# Patient Record
Sex: Female | Born: 1960 | Race: Black or African American | Hispanic: No | Smoking: Never smoker
Health system: Southern US, Community
[De-identification: ages and names within clinical notes are randomized; demographics above are authoritative.]

## PROBLEM LIST (undated history)

## (undated) DIAGNOSIS — D709 Neutropenia, unspecified: Secondary | ICD-10-CM

## (undated) DIAGNOSIS — M79672 Pain in left foot: Secondary | ICD-10-CM

## (undated) DIAGNOSIS — H6993 Unspecified Eustachian tube disorder, bilateral: Secondary | ICD-10-CM

## (undated) DIAGNOSIS — C50919 Malignant neoplasm of unspecified site of unspecified female breast: Secondary | ICD-10-CM

## (undated) DIAGNOSIS — R000 Tachycardia, unspecified: Secondary | ICD-10-CM

## (undated) DIAGNOSIS — H6983 Other specified disorders of Eustachian tube, bilateral: Secondary | ICD-10-CM

## (undated) DIAGNOSIS — M79671 Pain in right foot: Secondary | ICD-10-CM

## (undated) HISTORY — DX: Tachycardia, unspecified: R00.0

## (undated) HISTORY — DX: Unspecified eustachian tube disorder, bilateral: H69.93

## (undated) HISTORY — DX: Pain in right foot: M79.671

## (undated) HISTORY — DX: Pain in right foot: M79.672

## (undated) HISTORY — PX: CARDIAC CATHETERIZATION: SHX172

## (undated) HISTORY — DX: Neutropenia, unspecified: D70.9

## (undated) HISTORY — DX: Other specified disorders of eustachian tube, bilateral: H69.83

---

## 1997-12-13 ENCOUNTER — Ambulatory Visit (HOSPITAL_COMMUNITY): Admission: RE | Admit: 1997-12-13 | Discharge: 1997-12-13 | Payer: Self-pay | Admitting: *Deleted

## 1997-12-19 ENCOUNTER — Encounter: Admission: RE | Admit: 1997-12-19 | Discharge: 1998-03-19 | Payer: Self-pay | Admitting: *Deleted

## 1998-03-23 ENCOUNTER — Encounter: Admission: RE | Admit: 1998-03-23 | Discharge: 1998-05-11 | Payer: Self-pay

## 1998-09-05 ENCOUNTER — Encounter: Payer: Self-pay | Admitting: Gastroenterology

## 1998-09-05 ENCOUNTER — Ambulatory Visit (HOSPITAL_COMMUNITY): Admission: RE | Admit: 1998-09-05 | Discharge: 1998-09-05 | Payer: Self-pay | Admitting: Gastroenterology

## 1999-05-09 ENCOUNTER — Emergency Department (HOSPITAL_COMMUNITY): Admission: EM | Admit: 1999-05-09 | Discharge: 1999-05-09 | Payer: Self-pay | Admitting: Emergency Medicine

## 1999-06-24 ENCOUNTER — Ambulatory Visit (HOSPITAL_COMMUNITY): Admission: RE | Admit: 1999-06-24 | Discharge: 1999-06-24 | Payer: Self-pay | Admitting: Hematology & Oncology

## 1999-06-24 ENCOUNTER — Encounter: Payer: Self-pay | Admitting: Hematology & Oncology

## 1999-06-25 ENCOUNTER — Ambulatory Visit (HOSPITAL_COMMUNITY): Admission: RE | Admit: 1999-06-25 | Discharge: 1999-06-25 | Payer: Self-pay | Admitting: Hematology & Oncology

## 1999-06-25 ENCOUNTER — Encounter: Admission: RE | Admit: 1999-06-25 | Discharge: 1999-06-25 | Payer: Self-pay | Admitting: Hematology & Oncology

## 1999-06-25 ENCOUNTER — Encounter: Payer: Self-pay | Admitting: Hematology & Oncology

## 1999-08-15 ENCOUNTER — Other Ambulatory Visit: Admission: RE | Admit: 1999-08-15 | Discharge: 1999-08-15 | Payer: Self-pay | Admitting: Obstetrics and Gynecology

## 1999-08-15 ENCOUNTER — Ambulatory Visit (HOSPITAL_COMMUNITY): Admission: RE | Admit: 1999-08-15 | Discharge: 1999-08-15 | Payer: Self-pay | Admitting: Obstetrics and Gynecology

## 1999-11-28 ENCOUNTER — Encounter: Admission: RE | Admit: 1999-11-28 | Discharge: 1999-11-28 | Payer: Self-pay | Admitting: Hematology & Oncology

## 1999-11-28 ENCOUNTER — Encounter: Payer: Self-pay | Admitting: Hematology & Oncology

## 1999-12-31 ENCOUNTER — Ambulatory Visit (HOSPITAL_COMMUNITY): Admission: RE | Admit: 1999-12-31 | Discharge: 1999-12-31 | Payer: Self-pay | Admitting: Neurology

## 2000-03-12 ENCOUNTER — Encounter: Admission: RE | Admit: 2000-03-12 | Discharge: 2000-03-12 | Payer: Self-pay | Admitting: Hematology & Oncology

## 2000-03-12 ENCOUNTER — Encounter: Payer: Self-pay | Admitting: Hematology & Oncology

## 2001-05-28 ENCOUNTER — Encounter: Payer: Self-pay | Admitting: Internal Medicine

## 2001-05-28 ENCOUNTER — Ambulatory Visit (HOSPITAL_COMMUNITY): Admission: RE | Admit: 2001-05-28 | Discharge: 2001-05-28 | Payer: Self-pay | Admitting: Internal Medicine

## 2001-06-30 ENCOUNTER — Encounter: Admission: RE | Admit: 2001-06-30 | Discharge: 2001-06-30 | Payer: Self-pay | Admitting: Infectious Diseases

## 2001-07-14 ENCOUNTER — Encounter: Admission: RE | Admit: 2001-07-14 | Discharge: 2001-07-14 | Payer: Self-pay | Admitting: Infectious Diseases

## 2001-09-01 ENCOUNTER — Ambulatory Visit (HOSPITAL_COMMUNITY): Admission: RE | Admit: 2001-09-01 | Discharge: 2001-09-01 | Payer: Self-pay | Admitting: Cardiology

## 2002-02-23 ENCOUNTER — Other Ambulatory Visit: Admission: RE | Admit: 2002-02-23 | Discharge: 2002-02-23 | Payer: Self-pay | Admitting: Internal Medicine

## 2004-05-31 ENCOUNTER — Ambulatory Visit: Payer: Self-pay | Admitting: Internal Medicine

## 2004-07-26 ENCOUNTER — Ambulatory Visit: Payer: Self-pay | Admitting: Internal Medicine

## 2005-03-05 ENCOUNTER — Emergency Department (HOSPITAL_COMMUNITY): Admission: EM | Admit: 2005-03-05 | Discharge: 2005-03-06 | Payer: Self-pay | Admitting: Emergency Medicine

## 2005-07-16 ENCOUNTER — Encounter: Payer: Self-pay | Admitting: *Deleted

## 2005-09-23 ENCOUNTER — Emergency Department (HOSPITAL_COMMUNITY): Admission: EM | Admit: 2005-09-23 | Discharge: 2005-09-23 | Payer: Self-pay | Admitting: Emergency Medicine

## 2005-09-26 ENCOUNTER — Encounter (HOSPITAL_COMMUNITY): Admission: RE | Admit: 2005-09-26 | Discharge: 2005-11-27 | Payer: Self-pay | Admitting: Emergency Medicine

## 2005-12-02 ENCOUNTER — Ambulatory Visit: Payer: Self-pay | Admitting: Internal Medicine

## 2005-12-16 ENCOUNTER — Ambulatory Visit (HOSPITAL_COMMUNITY): Admission: RE | Admit: 2005-12-16 | Discharge: 2005-12-16 | Payer: Self-pay | Admitting: Internal Medicine

## 2005-12-17 ENCOUNTER — Ambulatory Visit: Payer: Self-pay | Admitting: Internal Medicine

## 2006-02-20 ENCOUNTER — Encounter: Admission: RE | Admit: 2006-02-20 | Discharge: 2006-02-20 | Payer: Self-pay | Admitting: Family Medicine

## 2006-08-31 ENCOUNTER — Other Ambulatory Visit: Admission: RE | Admit: 2006-08-31 | Discharge: 2006-08-31 | Payer: Self-pay | Admitting: Family Medicine

## 2007-09-15 ENCOUNTER — Emergency Department (HOSPITAL_COMMUNITY): Admission: EM | Admit: 2007-09-15 | Discharge: 2007-09-15 | Payer: Self-pay | Admitting: Emergency Medicine

## 2007-09-16 ENCOUNTER — Other Ambulatory Visit: Admission: RE | Admit: 2007-09-16 | Discharge: 2007-09-16 | Payer: Self-pay | Admitting: Family Medicine

## 2008-12-01 ENCOUNTER — Emergency Department (HOSPITAL_COMMUNITY): Admission: EM | Admit: 2008-12-01 | Discharge: 2008-12-01 | Payer: Self-pay | Admitting: Emergency Medicine

## 2010-06-21 LAB — CBC
Platelets: 196 10*3/uL (ref 150–400)
WBC: 4.1 10*3/uL (ref 4.0–10.5)

## 2010-06-21 LAB — POCT I-STAT, CHEM 8
BUN: 14 mg/dL (ref 6–23)
Creatinine, Ser: 0.5 mg/dL (ref 0.4–1.2)
Hemoglobin: 14.6 g/dL (ref 12.0–15.0)
Potassium: 3.8 mEq/L (ref 3.5–5.1)
Sodium: 138 mEq/L (ref 135–145)

## 2010-06-21 LAB — DIFFERENTIAL
Basophils Absolute: 0 10*3/uL (ref 0.0–0.1)
Eosinophils Absolute: 0 10*3/uL (ref 0.0–0.7)
Lymphocytes Relative: 46 % (ref 12–46)
Lymphs Abs: 1.9 10*3/uL (ref 0.7–4.0)
Neutrophils Relative %: 33 % — ABNORMAL LOW (ref 43–77)

## 2010-08-02 NOTE — Assessment & Plan Note (Signed)
[facility]                               PULMONARY OFFICE NOTE   NAME:[name], [name]                     MRN:          [id number]  DATE:12/02/2005                            DOB:          04/11/1960    CHIEF COMPLAINT:  Dyspnea.   HISTORY OF PRESENT ILLNESS:  This is a 50 year old black female with  paroxysmal dyspnea that she first noticed in her 46s and varied between  having several attacks a week to having no attacks per month.  Almost all  of her attacks occur while sleeping.  She wakes up from a sound sleep with  severe sense of dyspnea, almost to the point of choking but denies any  loss of voice.  These episodes last from five minutes to less than an hour  and she apparently has called the paramedics twice.  She denies any  associated pleuritic or exertional chest pain, classic orthopnea, PND, or  associated leg swelling.   PAST MEDICAL HISTORY:  Significant for GERD, for which she was apparently  seen by Dr. Eloise Harman, but records are not available at the time of  dictation.  She is not on any chronic medication for GERD.   ALLERGIES:  None known.   MEDICATIONS:  Toprol 25 mg one daily.   SOCIAL HISTORY:  She quit smoking 30 years ago.  She denied any significant  respiratory complaints then.  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   FAMILY HISTORY:  Significant for asthma in brother, otherwise negative for  respiratory disease.  Negative for heart disease as well.   REVIEW OF SYSTEMS:  Taken in detail on the worksheet and significant for the  problems as outlined above.   PHYSICAL EXAMINATION:  GENERAL:  This is a slightly anxious black female in  acute distress.  VITAL SIGNS:  The patient had normal vital signs.  HEENT:  Normal except for hoarse phonation.  Oropharynx is clear.  NECK:  Supple without cervical adenopathy or tenderness.  Trachea is  midline.  LUNGS:  Lung fields perfectly clear bilaterally to auscultation and  percussion.  HEART:  Regular rhythm.  No murmur, rub, or gallop.  ABDOMEN:  Soft.  Benign.  EXTREMITIES:  Warm without calf tenderness, cyanosis, or clubbing.   IMPRESSION:  Probable nocturnal gastroesophageal reflux disease dating back  to her 54s, in a patient who previously was diagnosed with GERD by Dr.  Eloise Harman, but does not, I believe, perceive that these spells of choking  are actually related directly to acid reflux.  I explained that nocturnal  episodes of asthma can actually be caused by GERD, but the fact is that  these resolve with or without treatment, would indicate  asthma being less  likely.   I did recommend the following therefore:  Initiate Zegerid 40 mg at bedtime for at least two weeks and then proceed  with another choline challenge test to rule out or rule in primary airways  disease in the form of asthma.  If the spells resolved on Zegerid,  I would  recommend that she take this continuously, but note that this patient's  pattern is that she gets better for months which may translate into a  confusing cause and effect relationship between GERD treatment and  elimination of symptoms, and also toward poor long term adherence.  I  discussed these issues all openly with the patient in detail today.                                   Charlaine Dalton. Sherene Sires, MD, [facility]   MBW/MedQ  DD:  12/02/2005  DT:  12/03/2005  Job #:  [id number]   cc:   Lavonda Jumbo, M.D.

## 2010-08-02 NOTE — Assessment & Plan Note (Signed)
HEALTHCARE                               PULMONARY OFFICE NOTE   NAME:[name], [name]                     MRN:          [id number]  DATE:12/17/2005                            DOB:          Nov 06, 1960    HISTORY:  This is a pulmonary extended follow-up office visit.  This is a 50-  year-old black female with paroxysms of dyspnea dating back 20 years.  Today, she tells me that these vary from once a week to 4-5 times weekly,  usually occur at bedtime, and she is so afraid to lie down because she is  going to have another spell that she has now been sleeping in a recliner.  Despite taking Zegerid 40 mg at bedtime, she continues to have spells.  She returns after a methacholine challenge test done after two weeks of  being on Zegerid nightly, with 100% reproduction of all of her symptoms,  eliminated with the use of albuterol in the lab.  The actual results are  still pending.   She denies any loss of voice during the spells or dysphagia.  She denies any  fever, chills, sweats, orthopnea, PND, or leg swelling.   PAST MEDICAL HISTORY:  Significant for GERD, for which I now have the record  indicating that she did have hiatal hernia with mild esophagitis and  stricture, for which she underwent dilatation in 1998.  At that point, it  was apparently not recommended that she stay on lifelong GERD therapy  because it was felt to be relatively mild, and a 24-hour pH probe performed  in 1998 showed no correlation with her symptoms or significant proximal or  distal acid exposure.  (It is clearly documented that this study was done  off of medications.)   PHYSICAL EXAMINATION:  GENERAL: She is a pleasant, ambulatory black female  in no acute distress.  VITAL SIGNS:  She was afebrile, normal vital signs.  HEENT: Unremarkable. Oropharynx clear.  NECK:  Supple, without cervical adenopathy or tenderness.  Trachea is  midline.  No thyromegaly.  LUNG FIELDS:   Perfectly clear bilaterally to auscultation and percussion.  HEART:  Regular rate and rhythm, without murmur, gallop, or rub.  ABDOMEN: Soft, benign.  EXTREMITIES: Warm, without calf tenderness, cyanosis, or clubbing.   IMPRESSION:  Clinically, she had a positive methacholine challenge test,  with complete reproduction of her symptoms with methacholine exposure and  complete elimination after albuterol.  It is hard to believe that she could  be spontaneously having such improving dating back 20 years all related to  asthma without being treated for asthma, even with over-the-counter  medicines.  Nevertheless, I think it would be appropriate at this point,  based on the clinical response alone (even without corroborating FEV1 data),  to give her a trial of Symbicort 80/4.5, 2 puffs b.i.d.   I reviewed with her the data to date in her chart, including her previous  study by our GI Department, and recommended she continue Zegerid until she  has used up her present samples (approximately 2 more weeks), and after 2  weeks,  if she is feeling better, fill her prescription for Symbicort, take  it for a total of 3 months, and then return here for followup.  If she is  not feeling 100% better after 2 weeks of concomitant therapy with PPI and  Symbicort, I am going to ask her to return for reevaluation.   I spent extra time reviewing these issues with her and also teaching her  optimal MDI technique which she mastered only to about 50% effectiveness due  to the inability to trigger and breathe in at the same time.            ______________________________  Charlaine Dalton Sherene Sires, MD, [facility]      MBW/MedQ  DD:  12/17/2005  DT:  12/19/2005  Job #:  [id number]   cc:   Lavonda Jumbo, M.D.

## 2010-08-02 NOTE — H&P (Signed)
[place]  Patient:    [name], [name] Visit Number: [id number] MRN: [id number]          Service Type: CAT Location: [facility] 2899 11 Attending Physician:  Eleanora Neighbor Dictated by:   Jennet Maduro Earl Gala, R.N., A.N.P. Admit Date:  09/01/2001 Discharge Date: 09/01/2001   CC:         Theron Arista R. Myna Hidalgo, M.D.   History and Physical  DATE OF BIRTH:  07/06/1960  CHIEF COMPLAINT:  Chest pain.  HISTORY OF PRESENT ILLNESS:  [name] is a 50 year old black female who is a native of [place].  She presents to our office as a work-in evaluation on August 31, 2001 with two episodes of chest discomfort.  Her first episode occurred earlier this morning at approximately 2 a.m.  She had been decorating her living room and had been cooking and cleaning in her kitchen and then she had the onset of substernal chest discomfort.  She really had no associated palpitations and no other associated symptoms.  The discomfort lasted for approximately one hour.  She took some aspirin which perhaps eased her symptoms.  She was able to go to bed around 3 a.m. and woke this morning at approximately 8:30.  She did well up until about 11 oclock when she had recurrence of the symptoms.  She proceeded to the office for further evaluation.  She has had a known history of previous palpitations with a previous stress echocardiogram in February of 2001 which was unremarkable.  PAST MEDICAL HISTORY: 1. Palpitations now off beta blocker therapy. 2. Previous history of H. pylori. 3. Neutropenia of unknown etiology, currently followed by Dr. Arlan Organ.    She is status post two bone marrows.  ALLERGIES:  None.  CURRENT MEDICINES:  None.  FAMILY HISTORY:  Basically unknown.  Her father died in his 90s of unknown causes.  Mother died at the age of 31 of unknown causes.  SOCIAL HISTORY:  She has never smoked.  There is no alcohol use.  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  There is no tobacco, no alcohol.  REVIEW OF SYSTEMS:  Basically as noted above.  She notes she has been off of her beta blocker therapy for over the past three months and her palpitations have been fairly stable.  Her sleep habits are somewhat erratic due to her work schedule.  She has not been exercising as she should but really otherwise has been doing well up until earlier this morning.  Otherwise review of systems is negative.  PHYSICAL EXAMINATION:  GENERAL:  The patient is a young black female in no acute distress.  VITAL SIGNS:  Blood pressure is 130/80 sitting and 110/90 standing.  Heart rate is 84 and regular, respirations 18, she is afebrile.  SKIN:  Warm and dry.  Color is unremarkable.  LUNGS:  Clear.  HEART:  Shows a regular rhythm.  ABDOMEN:  Soft, positive bowel sounds, nontender.  EXTREMITIES:  Without edema.  NEUROLOGIC:  Intact.  No gross focal deficits.  LABORATORY DATA:  A routine exercise treadmill test was performed in the office which was positive for ischemia.  The patient was able to walk nine minutes and 30 seconds on the standard Bruce protocol.  With that she did have some exaggeration of her symptoms.  Her EKG improved quickly in the recovery phase.  Other labs are pending.  OVERALL IMPRESSION: 1. Chest pain, now with an abnormal treadmill  test. 2. Past history of palpitations. 3. Neutropenia of unknown etiology.  PLAN:  Will proceed on with elective cardiac catheterization.  The procedure has been reviewed in full detail and she is willing to proceed tomorrow morning.  She was placed back on Toprol XL 50 mg a day.  She will continue with aspirin daily.  She is to present to the emergency room if problems would arise, but for now, will make plans to be admitted in the morning to try to minimize any risk of infection due to her known neutropenic state. Dictated by:   Jennet Maduro Earl Gala, R.N., A.N.P. Attending Physician:   Eleanora Neighbor DD:  08/31/01 TD:  09/01/01 Job: [id number]

## 2010-08-02 NOTE — Cardiovascular Report (Signed)
[place]  Patient:    [name], [name] Visit Number: [id number] MRN: [id number]          Service Type: CAT Location: [facility] 2899 11 Attending Physician:  Eleanora Neighbor Dictated by:   Colleen Can. Deborah Chalk, M.D. Proc. Date: 09/01/01 Admit Date:  09/01/2001 Discharge Date: 09/01/2001                          Cardiac Catheterization  DATE OF BIRTH:  April 16, 1960  PROCEDURE:  Left heart catheterization with selective coronary angiography, left ventricular angiography.  TYPE AND SITE OF ENTRY:  Percutaneous right femoral artery.  CATHETERS:  The #6 [place] 4-curved Judkins right and left coronary catheters, #6 French pigtail ventriculographic catheter.  CONTRAST MATERIAL:  Omnipaque.  MEDICATIONS GIVEN PRIOR TO PROCEDURE:  Valium 10 mg p.o.  MEDICATIONS GIVEN DURING PROCEDURE:  Versed 2 mg IV.  COMMENTS:  The patient tolerated the procedure well.  HEMODYNAMIC DATA: 1. LV pressure was 115/17. 2. Aortic pressure was 111/62. 3. There was no aortic valve gradient noted on pullback.  ANGIOGRAPHIC DATA:  LEFT VENTRICULAR:  Left ventricular angiogram was performed in the RAO position.  Overall cardiac size and silhouette are normal.  Global ejection fraction is 60%.  There is no regional wall motion abnormality.  There is no mitral regurgitation.  No intracavitary filling defects.  CORONARY ARTERIES:  The coronary arteries arise with a definite left dominant situation.  It is a very small right coronary artery system. 1. 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    It trifurcates before the acute margin of the heart.  There is catheter    damping but there is no significant obstructive disease at the ostium from    flush shots.  There is no significant atherosclerosis but it is a small    vessel.  2. Left main coronary artery:  The left main coronary artery is short.  There    is catheter damping but it  is felt to be the angulation of the catheter.    Flush shots failed to confirm any ostial stenosis and there was not    profound damping present.  The left main coronary artery is relatively    normal.  3. Left circumflex:  The left circumflex is a moderate sized vessel.  It is a    dominant vessel but the posterior descending ends on the inferior wall and    most of the flow to the inferior wall is in fact related to the LAD    wrapping around the apex.  4. Intermedius:  There is a large intermedius vessel that arises where the    left main trifurcates.  It is normal.  5. Left anterior descending artery:  The left anterior descending artery is a    long vessel that extends to and wraps around the apex and extends on the    inferior wall.  It is normal.  There is no atherosclerosis.  OVERALL IMPRESSION: 1. Normal left ventricular function. 2. Essentially normal coronary arteries but with a congenitally small right    coronary artery, a relatively small dominant left circumflex with a large    intermedius and an exceptionally large left anterior descending as it wraps    around the apex.  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 Dictated by:   Colleen Can Deborah Chalk, M.D. Attending Physician:  Eleanora Neighbor DD:  09/01/01 TD:  09/01/01 Job: [id number]

## 2010-08-02 NOTE — Op Note (Signed)
[place]  Patient:    [name], [name]                     MRN: [id number] Proc. Date: 06/25/99 Adm. Date:  [id number] Attending:  Jim Desanctis                           Operative Report  PROCEDURE:  Bone marrow biopsy aspirate.  DESCRIPTION OF PROCEDURE:  Ms. Trias was brought to the short-stay unit. She was placed on a monitor.  An IV was placed.  Her pre-marrow examination was unremarkable.  She was given a total of 10 mg Versed IV for sedation.  The left posterior right hip area was prepped and draped in a sterile fashion.  Lidocaine, 10 cc, was infiltrated down into the periosteum.  A good aspirate and biopsy were obtained.  There were no bleeding complications.  Ms. Abbett vital signs remained stable throughout the procedure. DD:  06/25/99 TD:  06/25/99 Job: [id number]

## 2010-09-30 ENCOUNTER — Other Ambulatory Visit: Payer: Self-pay | Admitting: Interventional Cardiology

## 2010-09-30 DIAGNOSIS — I701 Atherosclerosis of renal artery: Secondary | ICD-10-CM

## 2010-09-30 DIAGNOSIS — R030 Elevated blood-pressure reading, without diagnosis of hypertension: Secondary | ICD-10-CM

## 2010-10-04 ENCOUNTER — Inpatient Hospital Stay
Admission: RE | Admit: 2010-10-04 | Discharge: 2010-10-04 | Payer: Self-pay | Source: Ambulatory Visit | Attending: Interventional Cardiology | Admitting: Interventional Cardiology

## 2010-10-08 ENCOUNTER — Other Ambulatory Visit: Payer: Self-pay | Admitting: Interventional Cardiology

## 2010-10-08 DIAGNOSIS — R0602 Shortness of breath: Secondary | ICD-10-CM

## 2010-10-09 ENCOUNTER — Other Ambulatory Visit: Payer: Self-pay

## 2010-10-10 ENCOUNTER — Other Ambulatory Visit: Payer: Self-pay

## 2010-10-14 ENCOUNTER — Ambulatory Visit
Admission: RE | Admit: 2010-10-14 | Discharge: 2010-10-14 | Disposition: A | Discharge status: Home / Self Care | Payer: BC Managed Care – PPO | Source: Ambulatory Visit | Attending: Interventional Cardiology | Admitting: Interventional Cardiology

## 2010-10-14 ENCOUNTER — Ambulatory Visit: Payer: Self-pay

## 2010-10-14 DIAGNOSIS — R0602 Shortness of breath: Secondary | ICD-10-CM

## 2010-10-14 MED ORDER — IOHEXOL 350 MG/ML SOLN
100.0000 mL | Freq: Once | INTRAVENOUS | Status: AC | PRN
  Administered 2010-10-14: 100 mL via INTRAVENOUS

## 2010-10-28 ENCOUNTER — Ambulatory Visit
Admission: RE | Admit: 2010-10-28 | Discharge: 2010-10-28 | Disposition: A | Discharge status: Home / Self Care | Payer: BC Managed Care – PPO | Source: Ambulatory Visit | Attending: Interventional Cardiology | Admitting: Interventional Cardiology

## 2010-10-28 DIAGNOSIS — I701 Atherosclerosis of renal artery: Secondary | ICD-10-CM

## 2010-10-28 DIAGNOSIS — R030 Elevated blood-pressure reading, without diagnosis of hypertension: Secondary | ICD-10-CM

## 2010-10-28 MED ORDER — IOHEXOL 350 MG/ML SOLN
100.0000 mL | Freq: Once | INTRAVENOUS | Status: AC | PRN
  Administered 2010-10-28: 100 mL via INTRAVENOUS

## 2010-11-04 ENCOUNTER — Ambulatory Visit (HOSPITAL_COMMUNITY)
Admission: RE | Admit: 2010-11-04 | Discharge: 2010-11-04 | Disposition: A | Discharge status: Home / Self Care | Payer: BC Managed Care – PPO | Source: Ambulatory Visit | Attending: Gastroenterology | Admitting: Gastroenterology

## 2010-11-04 DIAGNOSIS — R0602 Shortness of breath: Secondary | ICD-10-CM | POA: Insufficient documentation

## 2010-11-04 DIAGNOSIS — J45909 Unspecified asthma, uncomplicated: Secondary | ICD-10-CM | POA: Insufficient documentation

## 2010-11-04 DIAGNOSIS — R000 Tachycardia, unspecified: Secondary | ICD-10-CM | POA: Insufficient documentation

## 2010-12-04 NOTE — Op Note (Signed)
  NAMEOLUWADARA, GORMAN NO.:  [id number]  MEDICAL RECORD NO.:  [id number]  LOCATION:  [facility]                         FACILITY:  [facility]  PHYSICIAN:  Petra Kuba, M.D.    DATE OF BIRTH:  06/01/1960  DATE OF PROCEDURE:  11/13/2010 DATE OF DISCHARGE:  11/04/2010                              OPERATIVE REPORT   HISTORY:  The patient with questionable atypical reflux proceeded with a Bravo capsule to see if any signs of reflux.  Consent was signed after risks, benefits, methods, options.  PROCEDURE:  The endoscopy was done.  Please see the report and the Bravo capsule placed.  The computer generated report was given to me for my interpretation.  REPORT:  The patient had minimal reflux on both day 1 and day 2.  Her first day DeMeester score was 3.  Normal is less than 14.72.  On the second day, the results were similar and actually the DeMeester score was even less a total of 1.4.  Her present time of pH less than 4 with 5.3%, which is in the 95th percentile.  CONCLUSION:  Doubt reflux playing a role for her symptoms based on this study.  Happy to see back p.r.n. and we will also discuss a screening colonoscopy when we will call her with these reports.  Consider allergies consult versus ENT consult if not already done.          ______________________________ Petra Kuba, M.D.     MEM/MEDQ  D:  11/13/2010  T:  11/13/2010  Job:  [id number]  cc:   Alinda Sierras, PA  Dr. __________  Electronically Signed by Vida Rigger M.D. on 12/04/2010 02:52:32 PM

## 2010-12-04 NOTE — Op Note (Signed)
  NAMEJOHANA, HOPKINSON NO.:  [id number]  MEDICAL RECORD NO.:  [id number]  LOCATION:  [facility]LEN                         FACILITY:  [facility]  PHYSICIAN:  Petra Kuba, M.D.    DATE OF BIRTH:  October 13, 1960  DATE OF PROCEDURE:  11/04/2010 DATE OF DISCHARGE:                              OPERATIVE REPORT   PROCEDURE:  Esophagogastroduodenoscopy with Bravo placement.  INDICATION:  The patient with atypical reflux, want to confirm or rule out reflux.  Consent was signed after risks, benefits, methods, options thoroughly discussed in the office and prior to sedation.  MEDICINES USED:  Fentanyl 50 mcg, Versed 5 mg.  PROCEDURE IN DETAIL:  The video endoscope was inserted by direct vision. The esophagus was normal.  The GE junction was normal.  Scope passed into the stomach and advanced through a normal antrum, normal pylorus into a normal duodenal bulb and around the C-loop to a normal second portion of the duodenum.  Scope was withdrawn back to the bulb which was normal.  Scope was withdrawn back to the stomach and retroflexed. Cardia, fundus, angularis, lesser and greater curve were normal on retroflex visualization.  Straight visualization of the stomach was normal.  Scope was slowly withdrawn back to the upper esophageal sphincter which again confirmed normal esophagus.  Scope was advanced to the GE junction which was measured between 38 and 39 cm.  The Bravo capsule introducer was then properly marked, the scope was then removed, again confirming normal esophagus.  A quick look at the vocal cords were normal.  The scope was removed and the Bravo capsule was inserted in the customary fashion and the suction device was applied and held properly and after 45 seconds, the device was released.  The introducer was removed.  The scope was reinserted which showed the proper position of the Bravo capsule right around 30 to 31 cm and seemed to be attached properly.  The scope was  removed.  No additional findings were seen. The patient tolerated the procedure well.  There was no obvious immediate complication.  ENDOSCOPIC DIAGNOSES: 1. Essentially normal EGD. 2. Status post Bravo 48-hour pH probe placed in the customary fashion.  PLAN:  Await the Bravo computer-generated reflux pH report and then we will decide any further workup plans, medicine therapies, or other consults.          ______________________________ Petra Kuba, M.D.     MEM/MEDQ  D:  11/04/2010  T:  11/04/2010  Job:  [id number]  cc:   Chales Salmon. Abigail Miyamoto, M.D. Fax: [phone]  Electronically Signed by Vida Rigger M.D. on 12/04/2010 02:52:28 PM

## 2010-12-12 LAB — POCT I-STAT, CHEM 8
Chloride: 105
Glucose, Bld: 106 — ABNORMAL HIGH
HCT: 42
Potassium: 3.8
Sodium: 136

## 2011-02-14 ENCOUNTER — Other Ambulatory Visit: Payer: Self-pay | Admitting: Family Medicine

## 2011-02-14 DIAGNOSIS — Z1231 Encounter for screening mammogram for malignant neoplasm of breast: Secondary | ICD-10-CM

## 2011-02-17 ENCOUNTER — Other Ambulatory Visit (HOSPITAL_COMMUNITY)
Admission: RE | Admit: 2011-02-17 | Discharge: 2011-02-17 | Disposition: A | Discharge status: Home / Self Care | Payer: BC Managed Care – PPO | Source: Ambulatory Visit | Attending: Family Medicine | Admitting: Family Medicine

## 2011-02-17 ENCOUNTER — Other Ambulatory Visit: Payer: Self-pay | Admitting: Family Medicine

## 2011-02-17 DIAGNOSIS — Z01419 Encounter for gynecological examination (general) (routine) without abnormal findings: Secondary | ICD-10-CM | POA: Insufficient documentation

## 2011-02-25 ENCOUNTER — Ambulatory Visit
Admission: RE | Admit: 2011-02-25 | Discharge: 2011-02-25 | Disposition: A | Discharge status: Home / Self Care | Payer: BC Managed Care – PPO | Source: Ambulatory Visit | Attending: Family Medicine | Admitting: Family Medicine

## 2011-02-25 DIAGNOSIS — Z1231 Encounter for screening mammogram for malignant neoplasm of breast: Secondary | ICD-10-CM

## 2011-03-05 ENCOUNTER — Other Ambulatory Visit: Payer: Self-pay | Admitting: Family Medicine

## 2011-03-05 DIAGNOSIS — R220 Localized swelling, mass and lump, head: Secondary | ICD-10-CM

## 2011-03-05 DIAGNOSIS — N959 Unspecified menopausal and perimenopausal disorder: Secondary | ICD-10-CM

## 2011-03-13 ENCOUNTER — Ambulatory Visit
Admission: RE | Admit: 2011-03-13 | Discharge: 2011-03-13 | Disposition: A | Discharge status: Home / Self Care | Payer: BC Managed Care – PPO | Source: Ambulatory Visit | Attending: Family Medicine | Admitting: Family Medicine

## 2011-03-13 DIAGNOSIS — Z780 Asymptomatic menopausal state: Secondary | ICD-10-CM

## 2011-03-13 DIAGNOSIS — N959 Unspecified menopausal and perimenopausal disorder: Secondary | ICD-10-CM

## 2011-03-13 DIAGNOSIS — R220 Localized swelling, mass and lump, head: Secondary | ICD-10-CM

## 2012-07-21 ENCOUNTER — Other Ambulatory Visit (HOSPITAL_COMMUNITY)
Admission: RE | Admit: 2012-07-21 | Discharge: 2012-07-21 | Disposition: A | Discharge status: Home / Self Care | Payer: BC Managed Care – PPO | Source: Ambulatory Visit | Attending: Family Medicine | Admitting: Family Medicine

## 2012-07-21 ENCOUNTER — Other Ambulatory Visit: Payer: Self-pay | Admitting: Family Medicine

## 2012-07-21 DIAGNOSIS — Z0000 Encounter for general adult medical examination without abnormal findings: Secondary | ICD-10-CM | POA: Insufficient documentation

## 2013-03-08 ENCOUNTER — Ambulatory Visit
Admission: RE | Admit: 2013-03-08 | Discharge: 2013-03-08 | Disposition: A | Discharge status: Home / Self Care | Payer: BC Managed Care – PPO | Source: Ambulatory Visit | Attending: Family Medicine | Admitting: Family Medicine

## 2013-03-08 ENCOUNTER — Other Ambulatory Visit: Payer: Self-pay | Admitting: Family Medicine

## 2013-03-08 DIAGNOSIS — M25551 Pain in right hip: Secondary | ICD-10-CM

## 2013-03-09 ENCOUNTER — Other Ambulatory Visit: Payer: Self-pay | Admitting: Family Medicine

## 2013-03-09 DIAGNOSIS — R102 Pelvic and perineal pain: Secondary | ICD-10-CM

## 2013-03-14 ENCOUNTER — Ambulatory Visit
Admission: RE | Admit: 2013-03-14 | Discharge: 2013-03-14 | Disposition: A | Discharge status: Home / Self Care | Payer: BC Managed Care – PPO | Source: Ambulatory Visit | Attending: Family Medicine | Admitting: Family Medicine

## 2013-03-14 DIAGNOSIS — R102 Pelvic and perineal pain: Secondary | ICD-10-CM

## 2013-03-17 DIAGNOSIS — C50919 Malignant neoplasm of unspecified site of unspecified female breast: Secondary | ICD-10-CM

## 2013-03-17 HISTORY — PX: MASTECTOMY: SHX3

## 2013-03-17 HISTORY — DX: Malignant neoplasm of unspecified site of unspecified female breast: C50.919

## 2013-04-01 ENCOUNTER — Other Ambulatory Visit: Payer: Self-pay | Admitting: Obstetrics and Gynecology

## 2013-04-01 DIAGNOSIS — N83209 Unspecified ovarian cyst, unspecified side: Secondary | ICD-10-CM

## 2013-04-06 ENCOUNTER — Ambulatory Visit
Admission: RE | Admit: 2013-04-06 | Discharge: 2013-04-06 | Disposition: A | Discharge status: Home / Self Care | Payer: No Typology Code available for payment source | Source: Ambulatory Visit | Attending: Family Medicine | Admitting: Family Medicine

## 2013-04-06 ENCOUNTER — Other Ambulatory Visit: Payer: Self-pay | Admitting: Family Medicine

## 2013-04-06 DIAGNOSIS — R079 Chest pain, unspecified: Secondary | ICD-10-CM

## 2013-04-07 ENCOUNTER — Other Ambulatory Visit: Payer: BC Managed Care – PPO

## 2013-04-09 ENCOUNTER — Other Ambulatory Visit: Payer: BC Managed Care – PPO

## 2013-04-12 ENCOUNTER — Other Ambulatory Visit: Payer: BC Managed Care – PPO

## 2013-12-13 ENCOUNTER — Encounter: Payer: Self-pay | Admitting: *Deleted

## 2014-01-04 ENCOUNTER — Other Ambulatory Visit (HOSPITAL_COMMUNITY)
Admission: RE | Admit: 2014-01-04 | Discharge: 2014-01-04 | Disposition: A | Discharge status: Home / Self Care | Payer: No Typology Code available for payment source | Source: Ambulatory Visit | Attending: Family Medicine | Admitting: Family Medicine

## 2014-01-04 ENCOUNTER — Other Ambulatory Visit: Payer: Self-pay | Admitting: Family Medicine

## 2014-01-04 DIAGNOSIS — Z01419 Encounter for gynecological examination (general) (routine) without abnormal findings: Secondary | ICD-10-CM | POA: Diagnosis present

## 2014-01-05 LAB — CYTOLOGY - PAP

## 2014-01-10 ENCOUNTER — Other Ambulatory Visit: Payer: Self-pay | Admitting: Family Medicine

## 2014-01-10 DIAGNOSIS — N6452 Nipple discharge: Secondary | ICD-10-CM

## 2014-01-10 DIAGNOSIS — N644 Mastodynia: Secondary | ICD-10-CM

## 2014-01-19 ENCOUNTER — Ambulatory Visit
Admission: RE | Admit: 2014-01-19 | Discharge: 2014-01-19 | Disposition: A | Discharge status: Home / Self Care | Payer: No Typology Code available for payment source | Source: Ambulatory Visit | Attending: Family Medicine | Admitting: Family Medicine

## 2014-01-19 ENCOUNTER — Other Ambulatory Visit: Payer: Self-pay | Admitting: Family Medicine

## 2014-01-19 DIAGNOSIS — N6452 Nipple discharge: Secondary | ICD-10-CM

## 2014-01-19 DIAGNOSIS — N644 Mastodynia: Secondary | ICD-10-CM

## 2014-01-20 ENCOUNTER — Ambulatory Visit
Admission: RE | Admit: 2014-01-20 | Discharge: 2014-01-20 | Disposition: A | Discharge status: Home / Self Care | Payer: No Typology Code available for payment source | Source: Ambulatory Visit | Attending: Family Medicine | Admitting: Family Medicine

## 2014-01-20 ENCOUNTER — Other Ambulatory Visit: Payer: Self-pay | Admitting: Family Medicine

## 2014-01-20 DIAGNOSIS — N644 Mastodynia: Secondary | ICD-10-CM

## 2014-01-20 DIAGNOSIS — N6452 Nipple discharge: Secondary | ICD-10-CM

## 2014-01-23 ENCOUNTER — Other Ambulatory Visit: Payer: Self-pay | Admitting: Family Medicine

## 2014-01-23 DIAGNOSIS — C50912 Malignant neoplasm of unspecified site of left female breast: Secondary | ICD-10-CM

## 2014-01-25 ENCOUNTER — Other Ambulatory Visit (INDEPENDENT_AMBULATORY_CARE_PROVIDER_SITE_OTHER): Payer: Self-pay | Admitting: Surgery

## 2014-01-26 ENCOUNTER — Ambulatory Visit
Admission: RE | Admit: 2014-01-26 | Discharge: 2014-01-26 | Disposition: A | Discharge status: Home / Self Care | Payer: No Typology Code available for payment source | Source: Ambulatory Visit | Attending: Family Medicine | Admitting: Family Medicine

## 2014-01-26 DIAGNOSIS — C50912 Malignant neoplasm of unspecified site of left female breast: Secondary | ICD-10-CM

## 2014-01-26 MED ORDER — GADOBENATE DIMEGLUMINE 529 MG/ML IV SOLN
15.0000 mL | Freq: Once | INTRAVENOUS | Status: AC | PRN
  Administered 2014-01-26: 15 mL via INTRAVENOUS

## 2014-02-02 ENCOUNTER — Telehealth: Payer: Self-pay | Admitting: Genetic Counselor

## 2014-02-02 NOTE — Telephone Encounter (Signed)
S/W PATIENT AND GAVE GENETIC APPT FOR 12/03 @ 12:30 W/GENETIC COUNSELOR.  WELCOME PACKET MAILED W/CALENDAR MAILED.

## 2014-02-13 ENCOUNTER — Other Ambulatory Visit: Payer: No Typology Code available for payment source

## 2014-02-16 ENCOUNTER — Other Ambulatory Visit: Payer: No Typology Code available for payment source

## 2014-02-16 ENCOUNTER — Other Ambulatory Visit (INDEPENDENT_AMBULATORY_CARE_PROVIDER_SITE_OTHER): Payer: Self-pay | Admitting: Surgery

## 2014-02-16 DIAGNOSIS — C50912 Malignant neoplasm of unspecified site of left female breast: Secondary | ICD-10-CM

## 2014-02-20 ENCOUNTER — Ambulatory Visit
Admission: RE | Admit: 2014-02-20 | Discharge: 2014-02-20 | Disposition: A | Discharge status: Home / Self Care | Payer: No Typology Code available for payment source | Source: Ambulatory Visit | Attending: Obstetrics and Gynecology | Admitting: Obstetrics and Gynecology

## 2014-02-20 DIAGNOSIS — N83209 Unspecified ovarian cyst, unspecified side: Secondary | ICD-10-CM

## 2014-02-20 MED ORDER — GADOBENATE DIMEGLUMINE 529 MG/ML IV SOLN
16.0000 mL | Freq: Once | INTRAVENOUS | Status: AC | PRN
  Administered 2014-02-20: 16 mL via INTRAVENOUS

## 2014-03-17 HISTORY — PX: REDUCTION MAMMAPLASTY: SUR839

## 2014-07-14 ENCOUNTER — Other Ambulatory Visit: Payer: Self-pay | Admitting: Family Medicine

## 2014-07-14 DIAGNOSIS — R1900 Intra-abdominal and pelvic swelling, mass and lump, unspecified site: Secondary | ICD-10-CM

## 2014-07-18 ENCOUNTER — Other Ambulatory Visit: Payer: Self-pay | Admitting: Family Medicine

## 2014-07-18 DIAGNOSIS — R1900 Intra-abdominal and pelvic swelling, mass and lump, unspecified site: Secondary | ICD-10-CM

## 2014-07-28 ENCOUNTER — Ambulatory Visit
Admission: RE | Admit: 2014-07-28 | Discharge: 2014-07-28 | Disposition: A | Discharge status: Home / Self Care | Payer: 59 | Source: Ambulatory Visit | Attending: Family Medicine | Admitting: Family Medicine

## 2014-07-28 DIAGNOSIS — R1900 Intra-abdominal and pelvic swelling, mass and lump, unspecified site: Secondary | ICD-10-CM

## 2014-12-14 ENCOUNTER — Other Ambulatory Visit: Payer: Self-pay | Admitting: Family Medicine

## 2014-12-14 DIAGNOSIS — R109 Unspecified abdominal pain: Secondary | ICD-10-CM

## 2014-12-20 ENCOUNTER — Other Ambulatory Visit: Payer: 59

## 2014-12-21 ENCOUNTER — Ambulatory Visit
Admission: RE | Admit: 2014-12-21 | Discharge: 2014-12-21 | Disposition: A | Discharge status: Home / Self Care | Payer: 59 | Source: Ambulatory Visit | Attending: Family Medicine | Admitting: Family Medicine

## 2014-12-21 DIAGNOSIS — R109 Unspecified abdominal pain: Secondary | ICD-10-CM

## 2014-12-21 MED ORDER — IOPAMIDOL (ISOVUE-300) INJECTION 61%
100.0000 mL | Freq: Once | INTRAVENOUS | Status: AC | PRN
  Administered 2014-12-21: 100 mL via INTRAVENOUS

## 2015-01-09 ENCOUNTER — Other Ambulatory Visit: Payer: Self-pay | Admitting: Family Medicine

## 2015-01-09 DIAGNOSIS — R935 Abnormal findings on diagnostic imaging of other abdominal regions, including retroperitoneum: Secondary | ICD-10-CM

## 2015-01-24 ENCOUNTER — Ambulatory Visit
Admission: RE | Admit: 2015-01-24 | Discharge: 2015-01-24 | Disposition: A | Discharge status: Home / Self Care | Payer: 59 | Source: Ambulatory Visit | Attending: Family Medicine | Admitting: Family Medicine

## 2015-01-24 DIAGNOSIS — R935 Abnormal findings on diagnostic imaging of other abdominal regions, including retroperitoneum: Secondary | ICD-10-CM

## 2015-11-15 ENCOUNTER — Other Ambulatory Visit: Payer: Self-pay | Admitting: Family Medicine

## 2015-11-15 DIAGNOSIS — R1030 Lower abdominal pain, unspecified: Secondary | ICD-10-CM

## 2015-12-18 ENCOUNTER — Other Ambulatory Visit: Payer: Self-pay

## 2015-12-19 ENCOUNTER — Other Ambulatory Visit (HOSPITAL_COMMUNITY)
Admission: RE | Admit: 2015-12-19 | Discharge: 2015-12-19 | Disposition: A | Discharge status: Home / Self Care | Payer: BLUE CROSS/BLUE SHIELD | Source: Ambulatory Visit | Attending: Family Medicine | Admitting: Family Medicine

## 2015-12-19 ENCOUNTER — Other Ambulatory Visit: Payer: Self-pay | Admitting: Family Medicine

## 2015-12-19 DIAGNOSIS — Z01419 Encounter for gynecological examination (general) (routine) without abnormal findings: Secondary | ICD-10-CM | POA: Insufficient documentation

## 2015-12-21 LAB — CYTOLOGY - PAP

## 2015-12-27 ENCOUNTER — Ambulatory Visit
Admission: RE | Admit: 2015-12-27 | Discharge: 2015-12-27 | Disposition: A | Discharge status: Home / Self Care | Payer: BLUE CROSS/BLUE SHIELD | Source: Ambulatory Visit | Attending: Family Medicine | Admitting: Family Medicine

## 2015-12-27 DIAGNOSIS — R1030 Lower abdominal pain, unspecified: Secondary | ICD-10-CM

## 2016-01-10 ENCOUNTER — Ambulatory Visit
Admission: RE | Admit: 2016-01-10 | Discharge: 2016-01-10 | Disposition: A | Discharge status: Home / Self Care | Payer: BLUE CROSS/BLUE SHIELD | Source: Ambulatory Visit | Attending: Family Medicine | Admitting: Family Medicine

## 2016-01-10 ENCOUNTER — Other Ambulatory Visit: Payer: Self-pay | Admitting: Family Medicine

## 2016-01-10 DIAGNOSIS — R0781 Pleurodynia: Secondary | ICD-10-CM

## 2016-01-11 ENCOUNTER — Encounter (HOSPITAL_COMMUNITY): Payer: Self-pay | Admitting: Emergency Medicine

## 2016-01-11 ENCOUNTER — Ambulatory Visit (HOSPITAL_COMMUNITY)
Admission: EM | Admit: 2016-01-11 | Discharge: 2016-01-11 | Disposition: A | Discharge status: Home / Self Care | Payer: BLUE CROSS/BLUE SHIELD | Attending: Internal Medicine | Admitting: Internal Medicine

## 2016-01-11 DIAGNOSIS — R002 Palpitations: Secondary | ICD-10-CM

## 2016-01-11 DIAGNOSIS — R072 Precordial pain: Secondary | ICD-10-CM | POA: Diagnosis not present

## 2016-01-11 DIAGNOSIS — I10 Essential (primary) hypertension: Secondary | ICD-10-CM

## 2016-01-11 NOTE — Discharge Instructions (Signed)
Recommendations are that she be transferred to the emergency department via [facility]. Have recommended that we start an IV and administer nitroglycerin sublingually. The patient declines and prefers to have her brother come to get her and take her to the emergency department. It is possible that he may get worse or have other problems with your heart that cannot be attended to if you are waiting or being transferred by private vehicle. You are taking these risks by your on admission.

## 2016-01-11 NOTE — ED Provider Notes (Signed)
CSN: [id number]     Arrival date & time 01/11/16  1919 History   First MD Initiated Contact with Patient 01/11/16 2039     Chief Complaint  Patient presents with  . Chest Pain   (Consider location/radiation/quality/duration/timing/severity/associated sxs/prior Treatment) 55 year old female states that last evening she had an episode of mid substernal chest pain after eating. She states that she felt her heart racing faster as well as an elevation in blood pressure. She states her blood pressure registered 217/103. The pain lasted for approximately an hour. She described it as a pressure with pain radiating to the left arm. This was associated with lighted dizziness and occurred at rest. Positive for minor transient nausea but no vomiting. Patient states that she woke up this morning feeling weak and has felt weak most of the day and this evening approximately an hour before arriving she experienced midsternal chest pain again that persist at this time. Also described as pressure. She states that she feels better overall though. Denies shortness of breath, diaphoresis or GI symptoms currently. 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 She states she had a cardiac cath over 5 years ago and believes that the only positive finding was a smaller artery.       Past Medical History:  Diagnosis Date  . Neutropenia [facility])    Past Surgical History:  Procedure Laterality Date  . CARDIAC CATHETERIZATION     Family History  Problem Relation Age of Onset  . Arrhythmia Mother   . Hypertension Mother   . Asthma Brother   . Asthma Brother   . Asthma Brother    Social History  Substance Use Topics  . Smoking status: Never Smoker  . Smokeless tobacco: Never Used  . Alcohol use No   OB History    No data available     Review of Systems  Constitutional: Positive for activity change and fatigue.  HENT: Negative.   Respiratory: Negative.  Negative for  shortness of breath.   Cardiovascular: Positive for chest pain and palpitations. Negative for leg swelling.  Gastrointestinal: Negative.   Genitourinary: Negative.   Musculoskeletal: Negative.   Skin: Negative.   Neurological: Positive for dizziness. Negative for syncope, speech difficulty and weakness.    Allergies  Review of patient's allergies indicates no known allergies.  Home Medications   Prior to Admission medications   Medication Sig Start Date End Date Taking? Authorizing Provider  metoprolol tartrate (LOPRESSOR) 25 MG tablet Take 25 mg by mouth 2 (two) times daily. AS NEEDED   Yes Historical Provider, MD   Meds Ordered and Administered this Visit  Medications - No data to display  BP 117/62 (BP Location: Left Arm)   Pulse 71   Temp 98.2 F (36.8 C) (Oral)   Resp 18   SpO2 100%  No data found.   Physical Exam  Constitutional: She is oriented to person, place, and time. She appears well-developed and well-nourished. No distress.  HENT:  Head: Normocephalic and atraumatic.  Eyes: EOM are normal.  Neck: Normal range of motion. Neck supple.  Cardiovascular: Normal rate, regular rhythm, normal heart sounds and intact distal pulses.   Pulmonary/Chest: Effort normal and breath sounds normal. No respiratory distress. She has no wheezes. She has no rales. She exhibits tenderness.  Musculoskeletal: Normal range of motion. She exhibits no edema.  Neurological: She is alert and oriented to person, place, and time.  Skin: Skin is warm and dry. No  rash noted.  Psychiatric: She has a normal mood and affect.  Nursing note and vitals reviewed.   Urgent Care Course   Clinical Course  ED ECG REPORT   Date: 01/11/2016  Rate: 72  Rhythm: normal sinus rhythm  QRS Axis: normal  Intervals: normal  ST/T Wave abnormalities: normal  Conduction Disutrbances:none  Narrative Interpretation:   Old EKG Reviewed: unchanged  I have personally reviewed the EKG tracing and agree  with the computerized printout as noted.   Procedures (including critical care time)  Labs Review Labs Reviewed - No data to display  Imaging Review Dg Ribs Unilateral W/chest Left  Result Date: 01/10/2016 CLINICAL DATA:  Left rib pain for 2 weeks EXAM: LEFT RIBS AND CHEST - 3+ VIEW COMPARISON:  April 06, 2013 FINDINGS: No fracture or dislocation are seen involving the ribs. There is no evidence of pneumothorax or pleural effusion. Both lungs are clear. Heart size and mediastinal contours are within normal limits. IMPRESSION: Negative. Electronically Signed   By: Abelardo Diesel M.D.   On: 01/10/2016 16:45     Visual Acuity Review  Right Eye Distance:   Left Eye Distance:   Bilateral Distance:    Right Eye Near:   Left Eye Near:    Bilateral Near:         MDM   1. Precordial pain   2. Essential hypertension   3. Heart palpitations    Recommendations are that she be transferred to the emergency department via care Link. Have recommended that we start an IV and administer nitroglycerin sublingually. The patient declines and prefers to have her brother come to get her and take her to the emergency department. It is possible that he may get worse or have other problems with your heart that cannot be attended to if you are waiting or being transferred by private vehicle. You are taking these risks by your on admission. Patient signed AMA in regards to transport. Recommendations have been made clear, patient states she understands that there is a risk involved with her going to the emergency department prior to optimal management and transfer.    Janne Napoleon, NP 01/11/16 2104    Janne Napoleon, NP 01/11/16 2106

## 2016-01-11 NOTE — ED Triage Notes (Signed)
Pt c/o intermittent CP onset yest night associated w/HA, left shoulder pain, weakness, and diaphoresis  Denies SOB  States her BP yest 217/103 at home  BP today is 117/62  States she called PCP.... Was told to come here for a f/u  Felt yest like "passing out"   Has appt w/cardiologist next week  A&O x4... NAD

## 2016-01-13 ENCOUNTER — Other Ambulatory Visit: Payer: Self-pay | Admitting: Physician Assistant

## 2016-01-13 ENCOUNTER — Observation Stay (HOSPITAL_COMMUNITY)
Admission: EM | Admit: 2016-01-13 | Discharge: 2016-01-13 | Disposition: A | Discharge status: Home / Self Care | Payer: BLUE CROSS/BLUE SHIELD | Attending: Family Medicine | Admitting: Family Medicine

## 2016-01-13 ENCOUNTER — Observation Stay (HOSPITAL_COMMUNITY): Payer: BLUE CROSS/BLUE SHIELD

## 2016-01-13 ENCOUNTER — Observation Stay (HOSPITAL_BASED_OUTPATIENT_CLINIC_OR_DEPARTMENT_OTHER): Payer: BLUE CROSS/BLUE SHIELD

## 2016-01-13 ENCOUNTER — Encounter (HOSPITAL_COMMUNITY): Payer: Self-pay | Admitting: Emergency Medicine

## 2016-01-13 ENCOUNTER — Emergency Department (HOSPITAL_COMMUNITY): Payer: BLUE CROSS/BLUE SHIELD

## 2016-01-13 DIAGNOSIS — R0789 Other chest pain: Secondary | ICD-10-CM | POA: Diagnosis not present

## 2016-01-13 DIAGNOSIS — Z79899 Other long term (current) drug therapy: Secondary | ICD-10-CM | POA: Insufficient documentation

## 2016-01-13 DIAGNOSIS — R072 Precordial pain: Secondary | ICD-10-CM | POA: Diagnosis not present

## 2016-01-13 DIAGNOSIS — R079 Chest pain, unspecified: Secondary | ICD-10-CM

## 2016-01-13 DIAGNOSIS — Z7982 Long term (current) use of aspirin: Secondary | ICD-10-CM | POA: Diagnosis not present

## 2016-01-13 DIAGNOSIS — Z955 Presence of coronary angioplasty implant and graft: Secondary | ICD-10-CM | POA: Insufficient documentation

## 2016-01-13 DIAGNOSIS — R002 Palpitations: Secondary | ICD-10-CM

## 2016-01-13 DIAGNOSIS — Z853 Personal history of malignant neoplasm of breast: Secondary | ICD-10-CM | POA: Insufficient documentation

## 2016-01-13 DIAGNOSIS — I10 Essential (primary) hypertension: Secondary | ICD-10-CM | POA: Diagnosis present

## 2016-01-13 HISTORY — DX: Malignant neoplasm of unspecified site of unspecified female breast: C50.919

## 2016-01-13 LAB — RAPID URINE DRUG SCREEN, HOSP PERFORMED
AMPHETAMINES: NOT DETECTED
BARBITURATES: NOT DETECTED
BENZODIAZEPINES: NOT DETECTED
COCAINE: NOT DETECTED
Opiates: NOT DETECTED
TETRAHYDROCANNABINOL: NOT DETECTED

## 2016-01-13 LAB — NM MYOCAR MULTI W/SPECT W/WALL MOTION / EF
CHL CUP MPHR: 163 {beats}/min
CSEPPHR: 113 {beats}/min
Estimated workload: 1 METS
Exercise duration (min): 0 min
Exercise duration (sec): 0 s
Percent HR: 69 %
Rest HR: 70 {beats}/min

## 2016-01-13 LAB — BASIC METABOLIC PANEL
ANION GAP: 9 (ref 5–15)
BUN: 16 mg/dL (ref 6–20)
CO2: 27 mmol/L (ref 22–32)
Calcium: 10.4 mg/dL — ABNORMAL HIGH (ref 8.9–10.3)
Chloride: 103 mmol/L (ref 101–111)
Creatinine, Ser: 0.64 mg/dL (ref 0.44–1.00)
GLUCOSE: 95 mg/dL (ref 65–99)
POTASSIUM: 3.7 mmol/L (ref 3.5–5.1)
Sodium: 139 mmol/L (ref 135–145)

## 2016-01-13 LAB — URINALYSIS, ROUTINE W REFLEX MICROSCOPIC
BILIRUBIN URINE: NEGATIVE
Glucose, UA: NEGATIVE mg/dL
Hgb urine dipstick: NEGATIVE
KETONES UR: 15 mg/dL — AB
NITRITE: NEGATIVE
PROTEIN: NEGATIVE mg/dL
Specific Gravity, Urine: 1.021 (ref 1.005–1.030)
pH: 7.5 (ref 5.0–8.0)

## 2016-01-13 LAB — CBC
HEMATOCRIT: 45 % (ref 36.0–46.0)
HEMOGLOBIN: 15 g/dL (ref 12.0–15.0)
MCH: 29 pg (ref 26.0–34.0)
MCHC: 33.3 g/dL (ref 30.0–36.0)
MCV: 86.9 fL (ref 78.0–100.0)
Platelets: 183 10*3/uL (ref 150–400)
RBC: 5.18 MIL/uL — AB (ref 3.87–5.11)
RDW: 13.6 % (ref 11.5–15.5)
WBC: 5 10*3/uL (ref 4.0–10.5)

## 2016-01-13 LAB — URINE MICROSCOPIC-ADD ON

## 2016-01-13 LAB — I-STAT TROPONIN, ED: Troponin i, poc: 0 ng/mL (ref 0.00–0.08)

## 2016-01-13 LAB — TROPONIN I

## 2016-01-13 MED ORDER — ACETAMINOPHEN 325 MG PO TABS
650.0000 mg | ORAL_TABLET | ORAL | Status: DC | PRN
  Administered 2016-01-13: 650 mg via ORAL
  Filled 2016-01-13: qty 2

## 2016-01-13 MED ORDER — REGADENOSON 0.4 MG/5ML IV SOLN
0.4000 mg | Freq: Once | INTRAVENOUS | Status: AC
  Administered 2016-01-13: 0.4 mg via INTRAVENOUS
  Filled 2016-01-13: qty 5

## 2016-01-13 MED ORDER — ASPIRIN EC 81 MG PO TBEC: 324.0000 mg | DELAYED_RELEASE_TABLET | Freq: Once | ORAL | Status: DC

## 2016-01-13 MED ORDER — ONDANSETRON HCL 4 MG/2ML IJ SOLN: 4.0000 mg | Freq: Four times a day (QID) | INTRAMUSCULAR | Status: DC | PRN

## 2016-01-13 MED ORDER — HYDRALAZINE HCL 10 MG PO TABS: 10.0000 mg | ORAL_TABLET | Freq: Three times a day (TID) | ORAL | 0 refills | Status: AC | PRN

## 2016-01-13 MED ORDER — ENOXAPARIN SODIUM 40 MG/0.4ML ~~LOC~~ SOLN: 40.0000 mg | SUBCUTANEOUS | Status: DC

## 2016-01-13 MED ORDER — GI COCKTAIL ~~LOC~~: 30.0000 mL | Freq: Four times a day (QID) | ORAL | Status: DC | PRN

## 2016-01-13 MED ORDER — TECHNETIUM TC 99M TETROFOSMIN IV KIT
30.0000 | PACK | Freq: Once | INTRAVENOUS | Status: AC | PRN
  Administered 2016-01-13: 30 via INTRAVENOUS

## 2016-01-13 MED ORDER — TECHNETIUM TC 99M TETROFOSMIN IV KIT
10.0000 | PACK | Freq: Once | INTRAVENOUS | Status: AC | PRN
  Administered 2016-01-13: 10 via INTRAVENOUS

## 2016-01-13 MED ORDER — REGADENOSON 0.4 MG/5ML IV SOLN
INTRAVENOUS | Status: AC
  Filled 2016-01-13: qty 5

## 2016-01-13 MED ORDER — NITROGLYCERIN 2 % TD OINT
1.0000 [in_us] | TOPICAL_OINTMENT | Freq: Once | TRANSDERMAL | Status: AC
  Administered 2016-01-13: 1 [in_us] via TOPICAL
  Filled 2016-01-13: qty 1

## 2016-01-13 NOTE — Progress Notes (Signed)
1 day lexiscan myoview completed without complication. Pending final result by [facility] radiologist.  Signed, Almyra Deforest PA Pager: [phone]

## 2016-01-13 NOTE — ED Provider Notes (Signed)
[facility] DEPT Provider Note   CSN: [id number] Arrival date & time: 01/13/16  [id number]     History   Chief Complaint Chief Complaint  Patient presents with  . Chest Pain  . Hypertension    HPI [name] is a 55 y.o. female.  HPI  This is a 55 year old female who presents with chest pain. Patient reports that recently she has had increasing blood pressure. She states that she keeps a blood pressure cuff beside her bed because she frequently will wake up with palpitations and has a history of PVCs. However, she is noted that her blood pressure has been going up. Reports that she woke up this morning and probably 3 AM with chest pain and headache. Noted her blood pressures to be 200 over 100s. Currently her chest pain as 5 out of 10. She describes as pressure non-radiating. No known history of hypertension. Does report history of hyperlipidemia. She was seen and evaluated at urgent care yesterday and referred to the emergency department; however, patient chose to go home and "I thought I was getting better."  Past Medical History:  Diagnosis Date  . Neutropenia ([place])     There are no active problems to display for this patient.   Past Surgical History:  Procedure Laterality Date  . CARDIAC CATHETERIZATION      OB History    No data available       Home Medications    Prior to Admission medications   Medication Sig Start Date End Date Taking? Authorizing Provider  aspirin EC 81 MG tablet Take 324 mg by mouth once.   Yes Historical Provider, MD  cholecalciferol (VITAMIN D) 1000 units tablet Take 1,000 Units by mouth daily.   Yes Historical Provider, MD  metoprolol tartrate (LOPRESSOR) 25 MG tablet Take 25 mg by mouth 2 (two) times daily.    Yes Historical Provider, MD  Multiple Vitamin (MULTIVITAMIN WITH MINERALS) TABS tablet Take 1 tablet by mouth daily.   Yes Historical Provider, MD    Family History Family History  Problem Relation Age of Onset  .  Arrhythmia Mother   . Hypertension Mother   . Asthma Brother   . Asthma Brother   . Asthma Brother     Social History Social History  Substance Use Topics  . Smoking status: Never Smoker  . Smokeless tobacco: Never Used  . Alcohol use No     Allergies   Review of patient's allergies indicates no known allergies.   Review of Systems Review of Systems  Constitutional: Negative for fever.  Respiratory: Negative for shortness of breath.   Cardiovascular: Positive for chest pain. Negative for leg swelling.  Gastrointestinal: Negative for abdominal pain, nausea and vomiting.  Neurological: Positive for light-headedness and headaches.  All other systems reviewed and are negative.    Physical Exam Updated Vital Signs BP 161/80 (BP Location: Right Arm)   Pulse 80   Temp 98.5 F (36.9 C) (Oral)   Resp 18   SpO2 99%   Physical Exam  Constitutional: She is oriented to person, place, and time. She appears well-developed and well-nourished. No distress.  HENT:  Head: Normocephalic and atraumatic.  Eyes: Pupils are equal, round, and reactive to light.  Cardiovascular: Normal rate, regular rhythm and normal heart sounds.   No murmur heard. Pulmonary/Chest: Effort normal and breath sounds normal. No respiratory distress. She has no wheezes.  Abdominal: Soft. Bowel sounds are normal. There is no tenderness. There is no guarding.  Neurological: She  is alert and oriented to person, place, and time.  Skin: Skin is warm and dry.  Psychiatric: She has a normal mood and affect.  Nursing note and vitals reviewed.    ED Treatments / Results  Labs (all labs ordered are listed, but only abnormal results are displayed) Labs Reviewed  BASIC METABOLIC PANEL - Abnormal; Notable for the following:       Result Value   Calcium 10.4 (*)    All other components within normal limits  CBC - Abnormal; Notable for the following:    RBC 5.18 (*)    All other components within normal limits    I-STAT TROPOININ, ED    EKG  EKG Interpretation  Date/Time:  Sunday January 13 2016 04:48:37 EDT Ventricular Rate:  88 PR Interval:    QRS Duration: 83 QT Interval:  373 QTC Calculation: 452 R Axis:   40 Text Interpretation:  Sinus rhythm Probable left atrial enlargement Probable left ventricular hypertrophy since last tracing no significant change Confirmed by Kennie Snedden  MD, Loma Sousa ([id number]) on 01/13/2016 4:53:54 AM       Radiology No results found.  Procedures Procedures (including critical care time)  Medications Ordered in ED Medications  nitroGLYCERIN (NITROGLYN) 2 % ointment 1 inch (1 inch Topical Given 01/13/16 0529)     Initial Impression / Assessment and Plan / ED Course  I have reviewed the triage vital signs and the nursing notes.  Pertinent labs & imaging results that were available during my care of the patient were reviewed by me and considered in my medical decision making (see chart for details).  Clinical Course    Patient presents with chest pain. This is in the setting of high blood pressure readings at home. Was seen in urgent care for the same but did not follow up in the ER as recommended. Reports catheterization greater than 10 years ago and possibly an abnormal echo. She is nontoxic. EKG shows LVH but no acute ischemic changes. Patient was given nitroglycerin ointment and had resolution of her pain. Blood pressure improved. She took aspirin prior to arrival. Heart score is 4. Will admit for formal rule out.  Final Clinical Impressions(s) / ED Diagnoses   Final diagnoses:  Other chest pain  Hypertension, unspecified type    New Prescriptions New Prescriptions   No medications on file     Merryl Hacker, MD 01/13/16 [phone]

## 2016-01-13 NOTE — Consult Note (Signed)
CARDIOLOGY CONSULT NOTE   Patient ID: [name] MRN: [id number], DOB/AGE: 10/12/1960   Admit date: 01/13/2016 Date of Consult: 01/13/2016   Primary Physician: [name] (Inactive) Primary Cardiologist: new   Pt. Profile  Patient is a pleasant 55 year old African-American female with past medical history of palpitation controlled on metoprolol 25 mg twice a day presented with chest pain at rest.  Problem List  Past Medical History:  Diagnosis Date  . Neutropenia [facility])     Past Surgical History:  Procedure Laterality Date  . CARDIAC CATHETERIZATION       Allergies  No Known Allergies  HPI   Patient is a pleasant 55 year old African-American female with past medical history of palpitation controlled on metoprolol 25 mg twice a day and L breast CA s/p mastectomy in 2015 (no chemo or radiation). She was previously seen by [name] back in 2003. 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  She has been doing well for many years without significant chest discomfort. She occasional he does have nighttime palpitation where she says her heart rate go up to 120 to 140s. His relatively well controlled on metoprolol. Sometimes she can go as far as 6 months without chest discomfort. She says for the past week and half, she has been having more episodes of palpitation at night. Despite this, she has been able to do her traditional African dance without any significant exertional discomfort. Her heart rate is usually  well-controlled on metoprolol, and only recently that she have any significant breakthrough palpitations. She was up around 3 AM in the morning of 01/13/2016 when she started having significant chest discomfort, she says her blood pressure and her heart rate both shot up. Her blood pressure apparently went up to 190s and her heart rate also went up to 120s. They quickly came down, however her chest discomfort never completely went away. Even now after 6 hours, she still have a lobar chest discomfort. Her initial troponin was negative. EKG showed no significant ischemic changes. Cardiology has been consulted for chest pain.    Inpatient Medications  . aspirin EC  324 mg Oral Once  . enoxaparin (LOVENOX) injection  40 mg Subcutaneous Q24H    Family History Family History  Problem Relation Age of Onset  . Arrhythmia Mother   . Hypertension Mother   . Asthma Brother   . Asthma Brother   . Asthma Brother      Social History Social History   Social History  . Marital status: Divorced    Spouse name: N/A  . Number of children: N/A  . Years of education: N/A   Occupational History  . Not on file.   Social History Main Topics  . Smoking status: Never Smoker  . Smokeless tobacco: Never Used  . Alcohol use No  . Drug use: No  . Sexual activity: Not on file   Other Topics Concern  . Not on file   Social History Narrative  . No narrative on file     Review of Systems  General:  No chills, fever,  night sweats or weight changes.  Cardiovascular:  No dyspnea on exertion, edema, orthopnea, palpitations, paroxysmal nocturnal dyspnea. +chest pain Dermatological: No rash, lesions/masses Respiratory: No cough, dyspnea Urologic: No hematuria, dysuria Abdominal:   No nausea, vomiting, diarrhea, bright red blood per rectum, melena, or hematemesis Neurologic:  No visual changes, wkns, changes in mental status. All other systems reviewed and are otherwise negative except as noted  above.  Physical Exam  Blood pressure 128/75, pulse 76, temperature 97.7 F (36.5 C), temperature source Oral, resp. rate 14, height 5\' 6"  (1.676 m), weight 180 lb 8 oz (81.9 kg), SpO2 100 %.  General: Pleasant, NAD Psych: Normal affect. Neuro: Alert and oriented X 3. Moves all extremities spontaneously. HEENT: Normal  Neck: Supple without bruits or JVD. Lungs:  Resp regular and unlabored, CTA. Heart: RRR no s3, s4, or murmurs. Abdomen: Soft, non-tender, non-distended, BS + x 4.  Extremities: No clubbing, cyanosis or edema. DP/PT/Radials 2+ and equal bilaterally.  Labs  No results for input(s): CKTOTAL, CKMB, TROPONINI in the last 72 hours. Lab Results  Component Value Date   WBC 5.0 01/13/2016   HGB 15.0 01/13/2016   HCT 45.0 01/13/2016   MCV 86.9 01/13/2016   PLT 183 01/13/2016     Recent Labs Lab 01/13/16 0446  NA 139  K 3.7  CL 103  CO2 27  BUN 16  CREATININE 0.64  CALCIUM 10.4*  GLUCOSE 95   No results found for: CHOL, HDL, LDLCALC, TRIG No results found for: DDIMER  Radiology/Studies  Dg Chest 2 View  Result Date: 01/13/2016 CLINICAL DATA:  55 year old female with chest pain and shortness of breath EXAM: CHEST  2 VIEW COMPARISON:  Chest radiograph dated 01/10/2016 FINDINGS: The heart size and mediastinal contours are within normal limits. Both lungs are clear. The visualized skeletal structures are unremarkable. IMPRESSION: No active cardiopulmonary disease. Electronically Signed   By: Anner Crete M.D.   On: 01/13/2016 05:55   Dg Ribs Unilateral W/chest Left  Result Date: 01/10/2016 CLINICAL DATA:  Left rib pain for 2 weeks EXAM: LEFT RIBS AND CHEST - 3+ VIEW COMPARISON:  April 06, 2013 FINDINGS: No fracture or dislocation are seen involving the ribs. There is no evidence of pneumothorax or pleural effusion. Both lungs are clear. Heart size and mediastinal contours are within normal limits. IMPRESSION: Negative. Electronically Signed   By: Abelardo Diesel M.D.   On: 01/10/2016 16:45   US Abdomen Complete  Result Date: 12/27/2015 CLINICAL DATA:  Lower abdominal pain EXAM: ABDOMEN ULTRASOUND COMPLETE COMPARISON:  CT scan 12/21/2014 FINDINGS: Gallbladder: No gallstones or wall thickening visualized. No sonographic Murphy sign noted by sonographer. Common bile duct: Diameter: 4.2 mm in diameter within normal limits Liver: No focal lesion identified. Within normal limits in parenchymal echogenicity. IVC: No abnormality visualized. Pancreas: Visualized portion unremarkable. Spleen: Size and appearance within normal limits. Measures 4.3 cm in length Right Kidney: Length: Measures 11.2 cm. Echogenicity within normal limits. No mass or hydronephrosis visualized. Left Kidney: Length: 11.7 cm. Echogenicity within normal limits. No mass or hydronephrosis visualized. Abdominal aorta: No aneurysm visualized. Measures up to 2 cm in diameter. Other findings: None. IMPRESSION: 1. No gallstones are noted within gallbladder.  Normal CBD. 2. No hydronephrosis or renal calculi. 3. No aortic aneurysm. Electronically Signed   By: Lahoma Crocker M.D.   On: 12/27/2015 13:22   US Transvaginal Non-ob  Result Date: 12/27/2015 CLINICAL DATA:  Intermittent lower abdominal and pelvic pain and occasional abdominal bloating EXAM: TRANSABDOMINAL AND  TRANSVAGINAL ULTRASOUND OF PELVIS TECHNIQUE: Study was performed transabdominally to optimize pelvic field of view evaluation and transvaginally to optimize internal visceral architecture evaluation. COMPARISON:  January 24, 2015 FINDINGS: Uterus Measurements: 6.4 x 4.0 x 4.4 cm. There is a leiomyoma in the superior fundal region slightly toward the right measuring 2.1 x 1.9 x 1.9 cm. There is a pedunculated leiomyoma measuring 3.0 x 2.4 x 2.9 cm arising superiorly. These leiomyomas were present on prior study. Endometrium Thickness: 2 mm.  No focal abnormality visualized. Right ovary Measurements: 2.8 x 2.1 x 2.7 cm. There are small cysts  arising from the right ovary, largest measuring 1.6 x 1.5 x 1.5 cm. Left ovary Measurements: 2.9 x 2.6 x 3.5 cm. There are several small cysts arising from the left ovary measuring 2.0 x 2.0 x 2.4 cm. Other findings No abnormal free fluid. IMPRESSION: Uterine leiomyomas, similar to prior study. Small cystic areas in each ovary, probably physiologic follicles. This is almost certainly benign, but follow up ultrasound is recommended in 1 year according to the Society of Radiologists in [place] Statement (D Clovis Riley et al. Management of Asymptomatic Ovarian and Other Adnexal Cysts Imaged at [place]: Society of Radiologists in [place] Statement 2010. Radiology 256 (Sept 2010): [id number].). Endometrial thickness within normal limits for postmenopausal state. Electronically Signed   By: Lowella Grip III M.D.   On: 12/27/2015 15:56   US Pelvis Complete  Result Date: 12/27/2015 CLINICAL DATA:  Intermittent lower abdominal and pelvic pain and occasional abdominal bloating EXAM: TRANSABDOMINAL AND TRANSVAGINAL ULTRASOUND OF PELVIS TECHNIQUE: Study was performed transabdominally to optimize pelvic field of view evaluation and transvaginally to optimize internal visceral architecture evaluation. COMPARISON:  January 24, 2015 FINDINGS: Uterus Measurements: 6.4 x 4.0 x 4.4 cm. There is a leiomyoma in the superior fundal region slightly toward the right measuring 2.1 x 1.9 x 1.9 cm. There is a pedunculated leiomyoma measuring 3.0 x 2.4 x 2.9 cm arising superiorly. These leiomyomas were present on prior study. Endometrium Thickness: 2 mm.  No focal abnormality visualized. Right ovary Measurements: 2.8 x 2.1 x 2.7 cm. There are small cysts arising from the right ovary, largest measuring 1.6 x 1.5 x 1.5 cm. Left ovary Measurements: 2.9 x 2.6 x 3.5 cm. There are several small cysts arising from the left ovary measuring 2.0 x 2.0 x 2.4 cm. Other findings No abnormal free fluid.  IMPRESSION: Uterine leiomyomas, similar to prior study. Small cystic areas in each ovary, probably physiologic follicles. This is almost certainly benign, but follow up ultrasound is recommended in 1 year according to the Society of Radiologists in [place] Statement (D Clovis Riley et al. Management of Asymptomatic Ovarian and Other Adnexal Cysts Imaged at [place]: Society of Radiologists in [place] Statement 2010. Radiology 256 (Sept 2010): [id number].). Endometrial thickness within normal limits for postmenopausal state. Electronically Signed   By: Lowella Grip III M.D.   On: 12/27/2015 15:56    ECG  Normal sinus rhythm without significant ST-T wave changes.  ASSESSMENT AND PLAN  1. Prolonged chest pain without EKG changes  - She was able to exercise through her traditional African dance a week ago without exertional symptom. This time chest pain occur at rest. No EKG changes. First point-of-care troponin is negative. Second troponin pending.  - We plan for Lexiscan stress test as she has been on metoprolol for rate control purposes which may interfere with traditional treadmill test.  2. Palpitation: Previously well controlled on metoprolol 25  mg twice a day, she has been compliant with her medication. However recently, she has been having increasing episodes of palpitation, we can consider outpatient 30 day monitor. Will continue on metoprolol 25 mg twice a day.   Signed, Almyra Deforest, PA-C 01/13/2016, 11:50 AM   History and all data above reviewed.  Patient examined.  I agree with the findings as above.  The patient exam reveals COR:RRR  ,  Lungs: Clear  ,  Abd: Positive bowel sounds, no rebound no guarding, Ext No edema  .  All available labs, radiology testing, previous records reviewed. Agree with documented assessment and plan. Chest pain:  Atypical greater than typical features.  Lexiscan Myoview results pending.  Tachycardia:  Plan out patient  event monitor.  Also discussed Alive Cor.  Continue current therapy for now.  HTN:  Please send her home with hydralazine 10 mg prn.    Jeneen Rinks Jaise Moser  2:40 PM  01/13/2016

## 2016-01-13 NOTE — ED Notes (Signed)
Dr. Danford at bedside  

## 2016-01-13 NOTE — H&P (Signed)
History and Physical  Patient Name: [name]     [id number]    DOB: 1960/11/23    DOA: 01/13/2016 PCP: Lars Mage, MD (Inactive)   Patient coming from: Home     Chief Complaint: Chest pain  HPI: [name] is a 55 y.o. female with a past medical history significant for palpitations who presents with chest pain.  Her usual state of health until a few days ago, after dinner she was doing nothing in particular around the house when she started to notice a vague malaise, she checked her blood pressure and it was greater than 210/110, and she had vague substernal chest discomfort, maybe radiating to the arm, associated with nausea and dizziness, lasting minutes to an hour.    Friday night, she felt the pain again so she went to [facility], where she was referred to the ER.   She chose to go by private car, but instead drove home where the pain subsided.  Saturday evening, again the pain returned in the evening, substernal or left sided, vague, moderate in intensity, radiating to the left arm, associated with nausea, and so she came to the ER.    ED course: -Afebrile, heart rate 70s, respirations and pulse oximetry normal, blood pressure 165/80 -Initial ECG showed normal sinus rhythm and troponin was negative. -Na 139, K 3.7, Cr 0.64, WBC 5, Hgb 15 -Chest x-ray negative -She was given Nitropaste for hypertension and chest pain, and TRH were asked to evaluate for admission   In 2012 she had a chest pain evaluation by Dr. Doreatha Lew, had a positive exercise treadmill test that time, followed by cardiac cath (records not available, but she states that there were no stents placed). She has obesity, and paroxysmal hypertension, but no diabetes, known hyperlipidemia, smoking, family history.      Review of Systems:  Review of Systems  Cardiovascular: Positive for chest pain and palpitations. Negative for orthopnea and leg swelling.  Gastrointestinal: Positive for nausea.    Neurological: Positive for dizziness.  All other systems reviewed and are negative.    Past Medical History:  Diagnosis Date  . Neutropenia [facility])     Past Surgical History:  Procedure Laterality Date  . CARDIAC CATHETERIZATION      Social History: Patient does not smoke or drink alcohol to excess.  Patient walks unassisted.  She works for [employer]. She was born in [place].    No Known Allergies  Family history: Does not know her mother's health history well, nor her father's.  Thinks they may have had hypertension or diabetes.  Thinks an uncle had a stroke.  Prior to Admission medications   Medication Sig Start Date End Date Taking? Authorizing Provider  aspirin EC 81 MG tablet Take 324 mg by mouth once.   Yes Historical Provider, MD  cholecalciferol (VITAMIN D) 1000 units tablet Take 1,000 Units by mouth daily.   Yes Historical Provider, MD  metoprolol tartrate (LOPRESSOR) 25 MG tablet Take 25 mg by mouth 2 (two) times daily.    Yes Historical Provider, MD  Multiple Vitamin (MULTIVITAMIN WITH MINERALS) TABS tablet Take 1 tablet by mouth daily.   Yes Historical Provider, MD       Physical Exam: BP 123/66   Pulse 71   Temp 98.5 F (36.9 C) (Oral)   Resp 13   SpO2 99%  General appearance: Well-developed, adult female, alert and in no acute distress.   Eyes: Anicteric, conjunctiva pink, lids and lashes normal.  ENT: No nasal deformity, discharge, or epistaxis.  OP moist without lesions.   Skin: Warm and dry.   Cardiac: RRR, nl S1-S2, no murmurs appreciated.  Capillary refill is brisk.  JVP normal.  No LE edema.  Radial and DP pulses 2+ and symmetric.  No carotid bruits. Respiratory: Normal respiratory rate and rhythm.  CTAB without rales or wheezes. GI: Abdomen soft without rigidity.  No TTP. No ascites, distension.   MSK: No deformities or effusions.   Pain not reproduced with palpation of precordium.  No pain with arm movement. Neuro: Sensorium intact and  responding to questions, attention normal.  Speech is fluent.  Moves all extremities equally and with normal coordination.    Psych: Behavior appropriate.  Affect normal.  No evidence of aural or visual hallucinations or delusions.       Labs on Admission:  The metabolic panel shows normal electrolytes and renal function. The complete blood count shows no leukocytosis, anemia, thrombocytopenia. The initial troponin is negative.  Radiological Exams on Admission: Personally reviewed chest x-ray shows no focal opacity: Dg Chest 2 View  Result Date: 01/13/2016 CLINICAL DATA:  55 year old female with chest pain and shortness of breath EXAM: CHEST  2 VIEW COMPARISON:  Chest radiograph dated 01/10/2016 FINDINGS: The heart size and mediastinal contours are within normal limits. Both lungs are clear. The visualized skeletal structures are unremarkable. IMPRESSION: No active cardiopulmonary disease. Electronically Signed   By: Anner Crete M.D.   On: 01/13/2016 05:55    EKG: Independently reviewed. Rate 88, QTC 452, early repolarization pattern.    Assessment/Plan 1. Chest pain: This is new.  The patient has HEART score of 4. Her chest pain has atypical features.  Other potential causes of chest pain (PE, dissection, pancreatitis, pneumonia/effusion, pericarditis) are doubted.  We have been asked to admit the patient for observation and etiology consultation with Cardiology tomorrow.  -Serial troponins are ordered -Telemetry -Consult to cardiology, appreciate recommendations    2. HTN:  This is new.  She describes that she measures her blood pressure most days and it is almost always 120/70 or nearly so, but that every once in a while she has a large spike in blood pressure to 200/110 like today.       DVT prophylaxis: Lovenox Diet: NPO after 4am for anticipated stress testing Code Status: FULL  Family Communication: Brother at bedside  Disposition Plan: Anticipate overnight  observation for arrhythmia on telemetry, serial troponins and subsequent risk stratification by Cardiology.  If testing negative, home after. Consults called: None overnight Admission status: Telemetry, OBS   Medical decision making: Patient seen at 6:40 AM on 01/13/2016.  The patient was discussed with Dr. Dina Rich. What exists of the patient's chart was reviewed in depth.  Clinical condition: stable.      Edwin Dada [facility] Pager [phone]

## 2016-01-13 NOTE — Discharge Instructions (Signed)
Chest Wall Pain Chest wall pain is pain in or around the bones and muscles of your chest. Sometimes, an injury causes this pain. Sometimes, the cause may not be known. This pain may take several weeks or longer to get better. HOME CARE INSTRUCTIONS  Pay attention to any changes in your symptoms. Take these actions to help with your pain:   Rest as told by your health care provider.   Avoid activities that cause pain. These include any activities that use your chest muscles or your abdominal and side muscles to lift heavy items.   If directed, apply ice to the painful area:  Put ice in a plastic bag.  Place a towel between your skin and the bag.  Leave the ice on for 20 minutes, 2-3 times per day.  Take over-the-counter and prescription medicines only as told by your health care provider.  Do not use tobacco products, including cigarettes, chewing tobacco, and e-cigarettes. If you need help quitting, ask your health care provider.  Keep all follow-up visits as told by your health care provider. This is important. SEEK MEDICAL CARE IF:  You have a fever.  Your chest pain becomes worse.  You have new symptoms. SEEK IMMEDIATE MEDICAL CARE IF:  You have nausea or vomiting.  You feel sweaty or light-headed.  You have a cough with phlegm (sputum) or you cough up blood.  You develop shortness of breath.   This information is not intended to replace advice given to you by your health care provider. Make sure you discuss any questions you have with your health care provider.   Document Released: 03/03/2005 Document Revised: 11/22/2014 Document Reviewed: 05/29/2014 Elsevier Interactive Patient Education 2016 [employer].   Hypertension Hypertension, commonly called high blood pressure, is when the force of blood pumping through your arteries is too strong. Your arteries are the blood vessels that carry blood from your heart throughout your body. A blood pressure reading  consists of a higher number over a lower number, such as 110/72. The higher number (systolic) is the pressure inside your arteries when your heart pumps. The lower number (diastolic) is the pressure inside your arteries when your heart relaxes. Ideally you want your blood pressure below 120/80. Hypertension forces your heart to work harder to pump blood. Your arteries may become narrow or stiff. Having untreated or uncontrolled hypertension can cause heart attack, stroke, kidney disease, and other problems. RISK FACTORS Some risk factors for high blood pressure are controllable. Others are not.  Risk factors you cannot control include:   Race. You may be at higher risk if you are African American.  Age. Risk increases with age.  Gender. Men are at higher risk than women before age 75 years. After age 72, women are at higher risk than men. Risk factors you can control include:  Not getting enough exercise or physical activity.  Being overweight.  Getting too much fat, sugar, calories, or salt in your diet.  Drinking too much alcohol. SIGNS AND SYMPTOMS Hypertension does not usually cause signs or symptoms. Extremely high blood pressure (hypertensive crisis) may cause headache, anxiety, shortness of breath, and nosebleed. DIAGNOSIS To check if you have hypertension, your health care provider will measure your blood pressure while you are seated, with your arm held at the level of your heart. It should be measured at least twice using the same arm. Certain conditions can cause a difference in blood pressure between your right and left arms. A blood pressure reading that  is higher than normal on one occasion does not mean that you need treatment. If it is not clear whether you have high blood pressure, you may be asked to return on a different day to have your blood pressure checked again. Or, you may be asked to monitor your blood pressure at home for 1 or more weeks. TREATMENT Treating high  blood pressure includes making lifestyle changes and possibly taking medicine. Living a healthy lifestyle can help lower high blood pressure. You may need to change some of your habits. Lifestyle changes may include:  Following the DASH diet. This diet is high in fruits, vegetables, and whole grains. It is low in salt, red meat, and added sugars.  Keep your sodium intake below 2,300 mg per day.  Getting at least 30-45 minutes of aerobic exercise at least 4 times per week.  Losing weight if necessary.  Not smoking.  Limiting alcoholic beverages.  Learning ways to reduce stress. Your health care provider may prescribe medicine if lifestyle changes are not enough to get your blood pressure under control, and if one of the following is true:  You are 12-8 years of age and your systolic blood pressure is above 140.  You are 3 years of age or older, and your systolic blood pressure is above 150.  Your diastolic blood pressure is above 90.  You have diabetes, and your systolic blood pressure is over [id number] or your diastolic blood pressure is over 90.  You have kidney disease and your blood pressure is above 140/90.  You have heart disease and your blood pressure is above 140/90. Your personal target blood pressure may vary depending on your medical conditions, your age, and other factors. HOME CARE INSTRUCTIONS  Have your blood pressure rechecked as directed by your health care provider.   Take medicines only as directed by your health care provider. Follow the directions carefully. Blood pressure medicines must be taken as prescribed. The medicine does not work as well when you skip doses. Skipping doses also puts you at risk for problems.  Do not smoke.   Monitor your blood pressure at home as directed by your health care provider. SEEK MEDICAL CARE IF:   You think you are having a reaction to medicines taken.  You have recurrent headaches or feel dizzy.  You have swelling  in your ankles.  You have trouble with your vision. SEEK IMMEDIATE MEDICAL CARE IF:  You develop a severe headache or confusion.  You have unusual weakness, numbness, or feel faint.  You have severe chest or abdominal pain.  You vomit repeatedly.  You have trouble breathing. MAKE SURE YOU:   Understand these instructions.  Will watch your condition.  Will get help right away if you are not doing well or get worse.   This information is not intended to replace advice given to you by your health care provider. Make sure you discuss any questions you have with your health care provider.   Document Released: 03/03/2005 Document Revised: 07/18/2014 Document Reviewed: 12/24/2012 Elsevier Interactive Patient Education 2016 Else[employer] Inc[employer]   Nonspecific Chest Pain  Chest pain can be caused by many different conditions. There is always a chance that your pain could be related to something serious, such as a heart attack or a blood clot in your lungs. Chest pain can also be caused by conditions that are not life-threatening. If you have chest pain, it is very important to follow up with your health care provider. CAUSES  Chest  pain can be caused by:  Heartburn.  Pneumonia or bronchitis.  Anxiety or stress.  Inflammation around your heart (pericarditis) or lung (pleuritis or pleurisy).  A blood clot in your lung.  A collapsed lung (pneumothorax). It can develop suddenly on its own (spontaneous pneumothorax) or from trauma to the chest.  Shingles infection (varicella-zoster virus).  Heart attack.  Damage to the bones, muscles, and cartilage that make up your chest wall. This can include:  Bruised bones due to injury.  Strained muscles or cartilage due to frequent or repeated coughing or overwork.  Fracture to one or more ribs.  Sore cartilage due to inflammation (costochondritis). RISK FACTORS  Risk factors for chest pain may include:  Activities that increase your  risk for trauma or injury to your chest.  Respiratory infections or conditions that cause frequent coughing.  Medical conditions or overeating that can cause heartburn.  Heart disease or family history of heart disease.  Conditions or health behaviors that increase your risk of developing a blood clot.  Having had chicken pox (varicella zoster). SIGNS AND SYMPTOMS Chest pain can feel like:  Burning or tingling on the surface of your chest or deep in your chest.  Crushing, pressure, aching, or squeezing pain.  Dull or sharp pain that is worse when you move, cough, or take a deep breath.  Pain that is also felt in your back, neck, shoulder, or arm, or pain that spreads to any of these areas. Your chest pain may come and go, or it may stay constant. DIAGNOSIS Lab tests or other studies may be needed to find the cause of your pain. Your health care provider may have you take a test called an ambulatory ECG (electrocardiogram). An ECG records your heartbeat patterns at the time the test is performed. You may also have other tests, such as:  Transthoracic echocardiogram (TTE). During echocardiography, sound waves are used to create a picture of all of the heart structures and to look at how blood flows through your heart.  Transesophageal echocardiogram (TEE).This is a more advanced imaging test that obtains images from inside your body. It allows your health care provider to see your heart in finer detail.  Cardiac monitoring. This allows your health care provider to monitor your heart rate and rhythm in real time.  Holter monitor. This is a portable device that records your heartbeat and can help to diagnose abnormal heartbeats. It allows your health care provider to track your heart activity for several days, if needed.  Stress tests. These can be done through exercise or by taking medicine that makes your heart beat more quickly.  Blood tests.  Imaging tests. TREATMENT  Your  treatment depends on what is causing your chest pain. Treatment may include:  Medicines. These may include:  Acid blockers for heartburn.  Anti-inflammatory medicine.  Pain medicine for inflammatory conditions.  Antibiotic medicine, if an infection is present.  Medicines to dissolve blood clots.  Medicines to treat coronary artery disease.  Supportive care for conditions that do not require medicines. This may include:  Resting.  Applying heat or cold packs to injured areas.  Limiting activities until pain decreases. HOME CARE INSTRUCTIONS  If you were prescribed an antibiotic medicine, finish it all even if you start to feel better.  Avoid any activities that bring on chest pain.  Do not use any tobacco products, including cigarettes, chewing tobacco, or electronic cigarettes. If you need help quitting, ask your health care provider.  Do not drink  alcohol.  Take medicines only as directed by your health care provider.  Keep all follow-up visits as directed by your health care provider. This is important. This includes any further testing if your chest pain does not go away.  If heartburn is the cause for your chest pain, you may be told to keep your head raised (elevated) while sleeping. This reduces the chance that acid will go from your stomach into your esophagus.  Make lifestyle changes as directed by your health care provider. These may include:  Getting regular exercise. Ask your health care provider to suggest some activities that are safe for you.  Eating a heart-healthy diet. A registered dietitian can help you to learn healthy eating options.  Maintaining a healthy weight.  Managing diabetes, if necessary.  Reducing stress. SEEK MEDICAL CARE IF:  Your chest pain does not go away after treatment.  You have a rash with blisters on your chest.  You have a fever. SEEK IMMEDIATE MEDICAL CARE IF:   Your chest pain is worse.  You have an increasing  cough, or you cough up blood.  You have severe abdominal pain.  You have severe weakness.  You faint.  You have chills.  You have sudden, unexplained chest discomfort.  You have sudden, unexplained discomfort in your arms, back, neck, or jaw.  You have shortness of breath at any time.  You suddenly start to sweat, or your skin gets clammy.  You feel nauseous or you vomit.  You suddenly feel light-headed or dizzy.  Your heart begins to beat quickly, or it feels like it is skipping beats. These symptoms may represent a serious problem that is an emergency. Do not wait to see if the symptoms will go away. Get medical help right away. Call your local emergency services (911 in the [place] Do not drive yourself to the hospital.   This information is not intended to replace advice given to you by your health care provider. Make sure you discuss any questions you have with your health care provider.   Document Released: 12/11/2004 Document Revised: 03/24/2014 Document Reviewed: 10/07/2013 Elsevier Interactive Patient Education [employer].

## 2016-01-13 NOTE — ED Triage Notes (Signed)
Pt c/o substernal chest pain that began at 0300. Hx of htn with accompanying chest pain and headaches. Pt took 324 ASA

## 2016-01-13 NOTE — Discharge Summary (Signed)
Physician Discharge Summary  [name] [id number] DOB: [birth year] DOA: 01/13/2016  PCP: Lars Mage, MD (Inactive)  Admit date: 01/13/2016 Discharge date: 01/13/2016  Admitted From: Home Disposition:  Home  Recommendations for Outpatient Follow-up:  1. Follow up with PCP in 1-2 weeks 2. Please obtain BMP/CBC in one week   Home Health: NO Equipment/Devices: NONE  Discharge Condition:Stable CODE STATUS: FULL Diet recommendation: Heart Healthy   Brief/Interim Summary: 55 year old female with a history of hypertension and palpitations presents with chest discomfort for the past 2-3 days. The patient states that this began on 01/10/2016 when she had some vague malaise-type symptoms and felt her heart racing. Apparently, the patient checked her blood pressure at home and stated that it was 210/110 and her heart rate was 110 range. She took her nighttime metoprolol with some improvement. She had associated nausea with dizziness. On 01/11/2016, the patient developed vague chest discomfort again lasting up to one hour. She checked her blood pressure and stated that his systolic blood pressure was in the 180 range. The patient went to urgent care, and she was told to go to emergency Department. Because she was feeling better, the patient went home instead. On the evening of 01/12/2016, the patient developed chest discomfort again which found her to come to the emergency department. Again she had some associated nausea without any syncopal symptoms. In the emergency dept, the patient was once again hypertensive with a blood pressure 161/80 upon presentation. Initial troponin was negative. BMP and CBC were unremarkable. EKG showed sinus rhythm with possible LVH changes with slurring ST elevation V1-V3.   Cardiology was consulted to assist with management  Discharge Diagnoses:  Atypical chest pain  -Suspect this may be related to hypertension  -Patient endorses compliance with metoprolol  tartrate 25 mg twice a day which will be continued  -Echocardiogram--can be done out pt -01/13/16 myoview--low risk, EF 63% -Continue aspirin  -Cycle troponins--neg  Hypertension  -Continue metoprolol tartrate  -home with hydralazine 10 mg prn SBP >180 -Urine drug screen--neg  Palpitations -outpt event monitor per cardiology   Discharge Instructions  Discharge Instructions    Diet - low sodium heart healthy    Complete by:  As directed    Increase activity slowly    Complete by:  As directed        Medication List    TAKE these medications   aspirin EC 81 MG tablet Take 324 mg by mouth once.   cholecalciferol 1000 units tablet Commonly known as:  VITAMIN D Take 1,000 Units by mouth daily.   hydrALAZINE 10 MG tablet Commonly known as:  APRESOLINE Take 1 tablet (10 mg total) by mouth 3 (three) times daily as needed. SBP >180   metoprolol tartrate 25 MG tablet Commonly known as:  LOPRESSOR Take 25 mg by mouth 2 (two) times daily.   multivitamin with minerals Tabs tablet Take 1 tablet by mouth daily.       No Known Allergies  Consultations:  Cardiology   Procedures/Studies: Dg Chest 2 View  Result Date: 01/13/2016 CLINICAL DATA:  55 year old female with chest pain and shortness of breath EXAM: CHEST  2 VIEW COMPARISON:  Chest radiograph dated 01/10/2016 FINDINGS: The heart size and mediastinal contours are within normal limits. Both lungs are clear. The visualized skeletal structures are unremarkable. IMPRESSION: No active cardiopulmonary disease. Electronically Signed   By: Anner Crete M.D.   On: 01/13/2016 05:55   Dg Ribs Unilateral W/chest Left  Result Date: 01/10/2016 CLINICAL DATA:  Left rib pain for 2 weeks EXAM: LEFT RIBS AND CHEST - 3+ VIEW COMPARISON:  April 06, 2013 FINDINGS: No fracture or dislocation are seen involving the ribs. There is no evidence of pneumothorax or pleural effusion. Both lungs are clear. Heart size and mediastinal  contours are within normal limits. IMPRESSION: Negative. Electronically Signed   By: Abelardo Diesel M.D.   On: 01/10/2016 16:45   US Abdomen Complete  Result Date: 12/27/2015 CLINICAL DATA:  Lower abdominal pain EXAM: ABDOMEN ULTRASOUND COMPLETE COMPARISON:  CT scan 12/21/2014 FINDINGS: Gallbladder: No gallstones or wall thickening visualized. No sonographic Murphy sign noted by sonographer. Common bile duct: Diameter: 4.2 mm in diameter within normal limits Liver: No focal lesion identified. Within normal limits in parenchymal echogenicity. IVC: No abnormality visualized. Pancreas: Visualized portion unremarkable. Spleen: Size and appearance within normal limits. Measures 4.3 cm in length Right Kidney: Length: Measures 11.2 cm. Echogenicity within normal limits. No mass or hydronephrosis visualized. Left Kidney: Length: 11.7 cm. Echogenicity within normal limits. No mass or hydronephrosis visualized. Abdominal aorta: No aneurysm visualized. Measures up to 2 cm in diameter. Other findings: None. IMPRESSION: 1. No gallstones are noted within gallbladder.  Normal CBD. 2. No hydronephrosis or renal calculi. 3. No aortic aneurysm. Electronically Signed   By: Lahoma Crocker M.D.   On: 12/27/2015 13:22   US Transvaginal Non-ob  Result Date: 12/27/2015 CLINICAL DATA:  Intermittent lower abdominal and pelvic pain and occasional abdominal bloating EXAM: TRANSABDOMINAL AND TRANSVAGINAL ULTRASOUND OF PELVIS TECHNIQUE: Study was performed transabdominally to optimize pelvic field of view evaluation and transvaginally to optimize internal visceral architecture evaluation. COMPARISON:  January 24, 2015 FINDINGS: Uterus Measurements: 6.4 x 4.0 x 4.4 cm. There is a leiomyoma in the superior fundal region slightly toward the right measuring 2.1 x 1.9 x 1.9 cm. There is a pedunculated leiomyoma measuring 3.0 x 2.4 x 2.9 cm arising superiorly. These leiomyomas were present on prior study. Endometrium Thickness: 2 mm.  No focal  abnormality visualized. Right ovary Measurements: 2.8 x 2.1 x 2.7 cm. There are small cysts arising from the right ovary, largest measuring 1.6 x 1.5 x 1.5 cm. Left ovary Measurements: 2.9 x 2.6 x 3.5 cm. There are several small cysts arising from the left ovary measuring 2.0 x 2.0 x 2.4 cm. Other findings No abnormal free fluid. IMPRESSION: Uterine leiomyomas, similar to prior study. Small cystic areas in each ovary, probably physiologic follicles. This is almost certainly benign, but follow up ultrasound is recommended in 1 year according to the Society of Radiologists in [place] Statement (D Clovis Riley et al. Management of Asymptomatic Ovarian and Other Adnexal Cysts Imaged at [place]: Society of Radiologists in [place] Statement 2010. Radiology 256 (Sept 2010): [id number].). Endometrial thickness within normal limits for postmenopausal state. Electronically Signed   By: Lowella Grip III M.D.   On: 12/27/2015 15:56   US Pelvis Complete  Result Date: 12/27/2015 CLINICAL DATA:  Intermittent lower abdominal and pelvic pain and occasional abdominal bloating EXAM: TRANSABDOMINAL AND TRANSVAGINAL ULTRASOUND OF PELVIS TECHNIQUE: Study was performed transabdominally to optimize pelvic field of view evaluation and transvaginally to optimize internal visceral architecture evaluation. COMPARISON:  January 24, 2015 FINDINGS: Uterus Measurements: 6.4 x 4.0 x 4.4 cm. There is a leiomyoma in the superior fundal region slightly toward the right measuring 2.1 x 1.9 x 1.9 cm. There is a pedunculated leiomyoma measuring 3.0 x 2.4 x 2.9 cm arising superiorly. These leiomyomas were present on prior study. Endometrium Thickness: 2  mm.  No focal abnormality visualized. Right ovary Measurements: 2.8 x 2.1 x 2.7 cm. There are small cysts arising from the right ovary, largest measuring 1.6 x 1.5 x 1.5 cm. Left ovary Measurements: 2.9 x 2.6 x 3.5 cm. There are several small cysts arising  from the left ovary measuring 2.0 x 2.0 x 2.4 cm. Other findings No abnormal free fluid. IMPRESSION: Uterine leiomyomas, similar to prior study. Small cystic areas in each ovary, probably physiologic follicles. This is almost certainly benign, but follow up ultrasound is recommended in 1 year according to the Society of Radiologists in [place] Statement (D Clovis Riley et al. Management of Asymptomatic Ovarian and Other Adnexal Cysts Imaged at [place]: Society of Radiologists in [place] Statement 2010. Radiology 256 (Sept 2010): [id number].). Endometrial thickness within normal limits for postmenopausal state. Electronically Signed   By: Lowella Grip III M.D.   On: 12/27/2015 15:56   Nm Myocar Multi W/spect W/wall Motion / Ef  Result Date: 01/13/2016 CLINICAL DATA:  Chest pain EXAM: MYOCARDIAL IMAGING WITH SPECT (REST AND PHARMACOLOGIC-STRESS) GATED LEFT VENTRICULAR WALL MOTION STUDY LEFT VENTRICULAR EJECTION FRACTION TECHNIQUE: Standard myocardial SPECT imaging was performed after resting intravenous injection of 10 mCi Tc-64m tetrofosmin. Subsequently, intravenous infusion of Lexiscan was performed under the supervision of the Cardiology staff. At peak effect of the drug, 30 mCi Tc-76m tetrofosmin was injected intravenously and standard myocardial SPECT imaging was performed. Quantitative gated imaging was also performed to evaluate left ventricular wall motion, and estimate left ventricular ejection fraction. COMPARISON:  None. FINDINGS: Perfusion: No decreased activity in the left ventricle on stress imaging to suggest reversible ischemia or infarction. Wall Motion: Normal left ventricular wall motion. No left ventricular dilation. Left Ventricular Ejection Fraction: 63 % End diastolic volume 67 ml End systolic volume 25 ml IMPRESSION: 1. No reversible ischemia or infarction. 2. Normal left ventricular wall motion. 3. Left ventricular ejection fraction 63% 4. Non  invasive risk stratification*: Low risk. *2012 Appropriate Use Criteria for Coronary Revascularization Focused Update: J Am Coll Cardiol. [id number]. http://content.[email or web address]?[email or web address] Electronically Signed   By: Marybelle Killings M.D.   On: 01/13/2016 14:12        Discharge Exam: Vitals:   01/13/16 1246 01/13/16 1247  BP: 104/66 105/60  Pulse:    Resp:    Temp:     Vitals:   01/13/16 1240 01/13/16 1244 01/13/16 1246 01/13/16 1247  BP: (!) 81/46 (!) 84/63 104/66 105/60  Pulse: 66     Resp:      Temp:      TempSrc:      SpO2:      Weight:      Height:        General: Pt is alert, awake, not in acute distress Cardiovascular: RRR, S1/S2 +, no rubs, no gallops Respiratory: CTA bilaterally, no wheezing, no rhonchi Abdominal: Soft, NT, ND, bowel sounds + Extremities: no edema, no cyanosis   The results of significant diagnostics from this hospitalization (including imaging, microbiology, ancillary and laboratory) are listed below for reference.    Significant Diagnostic Studies: Dg Chest 2 View  Result Date: 01/13/2016 CLINICAL DATA:  55 year old female with chest pain and shortness of breath EXAM: CHEST  2 VIEW COMPARISON:  Chest radiograph dated 01/10/2016 FINDINGS: The heart size and mediastinal contours are within normal limits. Both lungs are clear. The visualized skeletal structures are unremarkable. IMPRESSION: No active cardiopulmonary disease. Electronically Signed   By: Anner Crete M.D.   On: 01/13/2016 05:55  Dg Ribs Unilateral W/chest Left  Result Date: 01/10/2016 CLINICAL DATA:  Left rib pain for 2 weeks EXAM: LEFT RIBS AND CHEST - 3+ VIEW COMPARISON:  April 06, 2013 FINDINGS: No fracture or dislocation are seen involving the ribs. There is no evidence of pneumothorax or pleural effusion. Both lungs are clear. Heart size and mediastinal contours are within normal limits. IMPRESSION: Negative. Electronically Signed   By: Abelardo Diesel M.D.   On: 01/10/2016 16:45   US Abdomen Complete  Result Date: 12/27/2015 CLINICAL DATA:  Lower abdominal pain EXAM: ABDOMEN ULTRASOUND COMPLETE COMPARISON:  CT scan 12/21/2014 FINDINGS: Gallbladder: No gallstones or wall thickening visualized. No sonographic Murphy sign noted by sonographer. Common bile duct: Diameter: 4.2 mm in diameter within normal limits Liver: No focal lesion identified. Within normal limits in parenchymal echogenicity. IVC: No abnormality visualized. Pancreas: Visualized portion unremarkable. Spleen: Size and appearance within normal limits. Measures 4.3 cm in length Right Kidney: Length: Measures 11.2 cm. Echogenicity within normal limits. No mass or hydronephrosis visualized. Left Kidney: Length: 11.7 cm. Echogenicity within normal limits. No mass or hydronephrosis visualized. Abdominal aorta: No aneurysm visualized. Measures up to 2 cm in diameter. Other findings: None. IMPRESSION: 1. No gallstones are noted within gallbladder.  Normal CBD. 2. No hydronephrosis or renal calculi. 3. No aortic aneurysm. Electronically Signed   By: Lahoma Crocker M.D.   On: 12/27/2015 13:22   US Transvaginal Non-ob  Result Date: 12/27/2015 CLINICAL DATA:  Intermittent lower abdominal and pelvic pain and occasional abdominal bloating EXAM: TRANSABDOMINAL AND TRANSVAGINAL ULTRASOUND OF PELVIS TECHNIQUE: Study was performed transabdominally to optimize pelvic field of view evaluation and transvaginally to optimize internal visceral architecture evaluation. COMPARISON:  January 24, 2015 FINDINGS: Uterus Measurements: 6.4 x 4.0 x 4.4 cm. There is a leiomyoma in the superior fundal region slightly toward the right measuring 2.1 x 1.9 x 1.9 cm. There is a pedunculated leiomyoma measuring 3.0 x 2.4 x 2.9 cm arising superiorly. These leiomyomas were present on prior study. Endometrium Thickness: 2 mm.  No focal abnormality visualized. Right ovary Measurements: 2.8 x 2.1 x 2.7 cm. There are small cysts  arising from the right ovary, largest measuring 1.6 x 1.5 x 1.5 cm. Left ovary Measurements: 2.9 x 2.6 x 3.5 cm. There are several small cysts arising from the left ovary measuring 2.0 x 2.0 x 2.4 cm. Other findings No abnormal free fluid. IMPRESSION: Uterine leiomyomas, similar to prior study. Small cystic areas in each ovary, probably physiologic follicles. This is almost certainly benign, but follow up ultrasound is recommended in 1 year according to the Society of Radiologists in [place] Statement (D Clovis Riley et al. Management of Asymptomatic Ovarian and Other Adnexal Cysts Imaged at [place]: Society of Radiologists in [place] Statement 2010. Radiology 256 (Sept 2010): [id number].). Endometrial thickness within normal limits for postmenopausal state. Electronically Signed   By: Lowella Grip III M.D.   On: 12/27/2015 15:56   US Pelvis Complete  Result Date: 12/27/2015 CLINICAL DATA:  Intermittent lower abdominal and pelvic pain and occasional abdominal bloating EXAM: TRANSABDOMINAL AND TRANSVAGINAL ULTRASOUND OF PELVIS TECHNIQUE: Study was performed transabdominally to optimize pelvic field of view evaluation and transvaginally to optimize internal visceral architecture evaluation. COMPARISON:  January 24, 2015 FINDINGS: Uterus Measurements: 6.4 x 4.0 x 4.4 cm. There is a leiomyoma in the superior fundal region slightly toward the right measuring 2.1 x 1.9 x 1.9 cm. There is a pedunculated leiomyoma measuring 3.0 x 2.4 x 2.9 cm  arising superiorly. These leiomyomas were present on prior study. Endometrium Thickness: 2 mm.  No focal abnormality visualized. Right ovary Measurements: 2.8 x 2.1 x 2.7 cm. There are small cysts arising from the right ovary, largest measuring 1.6 x 1.5 x 1.5 cm. Left ovary Measurements: 2.9 x 2.6 x 3.5 cm. There are several small cysts arising from the left ovary measuring 2.0 x 2.0 x 2.4 cm. Other findings No abnormal free fluid.  IMPRESSION: Uterine leiomyomas, similar to prior study. Small cystic areas in each ovary, probably physiologic follicles. This is almost certainly benign, but follow up ultrasound is recommended in 1 year according to the Society of Radiologists in [place] Statement (D Clovis Riley et al. Management of Asymptomatic Ovarian and Other Adnexal Cysts Imaged at [place]: Society of Radiologists in [place] Statement 2010. Radiology 256 (Sept 2010): [id number].). Endometrial thickness within normal limits for postmenopausal state. Electronically Signed   By: Lowella Grip III M.D.   On: 12/27/2015 15:56   Nm Myocar Multi W/spect W/wall Motion / Ef  Result Date: 01/13/2016 CLINICAL DATA:  Chest pain EXAM: MYOCARDIAL IMAGING WITH SPECT (REST AND PHARMACOLOGIC-STRESS) GATED LEFT VENTRICULAR WALL MOTION STUDY LEFT VENTRICULAR EJECTION FRACTION TECHNIQUE: Standard myocardial SPECT imaging was performed after resting intravenous injection of 10 mCi Tc-56m tetrofosmin. Subsequently, intravenous infusion of Lexiscan was performed under the supervision of the Cardiology staff. At peak effect of the drug, 30 mCi Tc-49m tetrofosmin was injected intravenously and standard myocardial SPECT imaging was performed. Quantitative gated imaging was also performed to evaluate left ventricular wall motion, and estimate left ventricular ejection fraction. COMPARISON:  None. FINDINGS: Perfusion: No decreased activity in the left ventricle on stress imaging to suggest reversible ischemia or infarction. Wall Motion: Normal left ventricular wall motion. No left ventricular dilation. Left Ventricular Ejection Fraction: 63 % End diastolic volume 67 ml End systolic volume 25 ml IMPRESSION: 1. No reversible ischemia or infarction. 2. Normal left ventricular wall motion. 3. Left ventricular ejection fraction 63% 4. Non invasive risk stratification*: Low risk. *2012 Appropriate Use Criteria for Coronary  Revascularization Focused Update: J Am Coll Cardiol. [id number]. http://[email or web address]?[email or web address] Electronically Signed   By: Marybelle Killings M.D.   On: 01/13/2016 14:12     Microbiology: No results found for this or any previous visit (from the past 240 hour(s)).   Labs: Basic Metabolic Panel:  Recent Labs Lab 01/13/16 0446  NA 139  K 3.7  CL 103  CO2 27  GLUCOSE 95  BUN 16  CREATININE 0.64  CALCIUM 10.4*   Liver Function Tests: No results for input(s): AST, ALT, ALKPHOS, BILITOT, PROT, ALBUMIN in the last 168 hours. No results for input(s): LIPASE, AMYLASE in the last 168 hours. No results for input(s): AMMONIA in the last 168 hours. CBC:  Recent Labs Lab 01/13/16 0446  WBC 5.0  HGB 15.0  HCT 45.0  MCV 86.9  PLT 183   Cardiac Enzymes:  Recent Labs Lab 01/13/16 1125  TROPONINI <0.03   BNP: Invalid input(s): POCBNP CBG: No results for input(s): GLUCAP in the last 168 hours.  Time coordinating discharge:  Greater than 30 minutes  Signed:  Jaylynne Birkhead, DO [facility] Pager: [phone] 01/13/2016, 3:46 PM

## 2016-01-13 NOTE — ED Notes (Signed)
Family at bedside. 

## 2016-01-13 NOTE — Progress Notes (Signed)
PROGRESS NOTE  [name] [id number] DOB: 01/15/1961 DOA: 01/13/2016 PCP: Lars Mage, MD (Inactive)  Brief History:  55 year old female with a history of hypertension and palpitations presents with chest discomfort for the past 2-3 days. The patient states that this began on 01/10/2016 when she had some vague malaise-type symptoms and felt her heart racing. Apparently, the patient checked her blood pressure at home and stated that it was 210/110 and her heart rate was 110 range. She took her nighttime metoprolol with some improvement. She had associated nausea with dizziness. On 01/11/2016, the patient developed vague chest discomfort again lasting up to one hour. She checked her blood pressure and stated that his systolic blood pressure was in the 180 range. The patient went to urgent care, and she was told to go to emergency Department. Because she was feeling better, the patient went home instead. On the evening of 01/12/2016, the patient developed chest discomfort again which found her to come to the emergency department. Again she had some associated nausea without any syncopal symptoms. In the emergency dept, the patient was once again hypertensive with a blood pressure 161/80 upon presentation. Initial troponin was negative. BMP and CBC were unremarkable. EKG showed sinus rhythm with possible LVH changes with slurring ST elevation V1-V3.   Assessment/Plan: Atypical chest pain  -Suspect this may be related to hypertension  -Patient endorses compliance with metoprolol tartrate 25 mg twice a day which will be continued  -Echocardiogram  -Continue aspirin  -Cycle troponins   Hypertension  -Continue metoprolol tartrate  -Urine drug screen     Disposition Plan:   Home in 1 day if cleared by cardiology Family Communication:   Son updated at bedside  Consultants:  cardiolgy  Code Status:  FULL / DNR  DVT Prophylaxis:   [place] Lovenox   Procedures: As Listed in  Progress Note Above  Antibiotics: None    Subjective: Patient denies fevers, chills, headache, chest pain, dyspnea, nausea, vomiting, diarrhea, abdominal pain, dysuria, hematuria, hematochezia, and melena.   Objective: Vitals:   01/13/16 0700 01/13/16 0715 01/13/16 0738 01/13/16 0745  BP: 123/74 124/77 127/81 (!) 128/102  Pulse: 77 77 76 78  Resp: 16 13 14 16   Temp:   97.6 F (36.4 C)   TempSrc:   Oral   SpO2: 99% 98% 98% 98%   No intake or output data in the 24 hours ending 01/13/16 0844 Weight change:  Exam:   General:  Pt is alert, follows commands appropriately, not in acute distress  HEENT: No icterus, No thrush, No neck mass, [place]/AT  Cardiovascular: RRR, S1/S2, no rubs, no gallops  Respiratory: CTA bilaterally, no wheezing, no crackles, no rhonchi  Abdomen: Soft/+BS, non tender, non distended, no guarding  Extremities: No edema, No lymphangitis, No petechiae, No rashes, no synovitis   Data Reviewed: I have personally reviewed following labs and imaging studies Basic Metabolic Panel:  Recent Labs Lab 01/13/16 0446  NA 139  K 3.7  CL 103  CO2 27  GLUCOSE 95  BUN 16  CREATININE 0.64  CALCIUM 10.4*   Liver Function Tests: No results for input(s): AST, ALT, ALKPHOS, BILITOT, PROT, ALBUMIN in the last 168 hours. No results for input(s): LIPASE, AMYLASE in the last 168 hours. No results for input(s): AMMONIA in the last 168 hours. Coagulation Profile: No results for input(s): INR, PROTIME in the last 168 hours. CBC:  Recent Labs Lab 01/13/16 0446  WBC 5.0  HGB 15.0  HCT 45.0  MCV 86.9  PLT 183   Cardiac Enzymes: No results for input(s): CKTOTAL, CKMB, CKMBINDEX, TROPONINI in the last 168 hours. BNP: Invalid input(s): POCBNP CBG: No results for input(s): GLUCAP in the last 168 hours. HbA1C: No results for input(s): HGBA1C in the last 72 hours. Urine analysis: No results found for: COLORURINE, APPEARANCEUR, LABSPEC, PHURINE, GLUCOSEU,  HGBUR, BILIRUBINUR, KETONESUR, PROTEINUR, UROBILINOGEN, NITRITE, LEUKOCYTESUR Sepsis Labs: @LABRCNTIP (procalcitonin:4,lacticidven:4) )No results found for this or any previous visit (from the past 240 hour(s)).   Scheduled Meds: Continuous Infusions:  Procedures/Studies: Dg Chest 2 View  Result Date: 01/13/2016 CLINICAL DATA:  55 year old female with chest pain and shortness of breath EXAM: CHEST  2 VIEW COMPARISON:  Chest radiograph dated 01/10/2016 FINDINGS: The heart size and mediastinal contours are within normal limits. Both lungs are clear. The visualized skeletal structures are unremarkable. IMPRESSION: No active cardiopulmonary disease. Electronically Signed   By: Anner Crete M.D.   On: 01/13/2016 05:55   Dg Ribs Unilateral W/chest Left  Result Date: 01/10/2016 CLINICAL DATA:  Left rib pain for 2 weeks EXAM: LEFT RIBS AND CHEST - 3+ VIEW COMPARISON:  April 06, 2013 FINDINGS: No fracture or dislocation are seen involving the ribs. There is no evidence of pneumothorax or pleural effusion. Both lungs are clear. Heart size and mediastinal contours are within normal limits. IMPRESSION: Negative. Electronically Signed   By: Abelardo Diesel M.D.   On: 01/10/2016 16:45   US Abdomen Complete  Result Date: 12/27/2015 CLINICAL DATA:  Lower abdominal pain EXAM: ABDOMEN ULTRASOUND COMPLETE COMPARISON:  CT scan 12/21/2014 FINDINGS: Gallbladder: No gallstones or wall thickening visualized. No sonographic Murphy sign noted by sonographer. Common bile duct: Diameter: 4.2 mm in diameter within normal limits Liver: No focal lesion identified. Within normal limits in parenchymal echogenicity. IVC: No abnormality visualized. Pancreas: Visualized portion unremarkable. Spleen: Size and appearance within normal limits. Measures 4.3 cm in length Right Kidney: Length: Measures 11.2 cm. Echogenicity within normal limits. No mass or hydronephrosis visualized. Left Kidney: Length: 11.7 cm. Echogenicity within  normal limits. No mass or hydronephrosis visualized. Abdominal aorta: No aneurysm visualized. Measures up to 2 cm in diameter. Other findings: None. IMPRESSION: 1. No gallstones are noted within gallbladder.  Normal CBD. 2. No hydronephrosis or renal calculi. 3. No aortic aneurysm. Electronically Signed   By: Lahoma Crocker M.D.   On: 12/27/2015 13:22   US Transvaginal Non-ob  Result Date: 12/27/2015 CLINICAL DATA:  Intermittent lower abdominal and pelvic pain and occasional abdominal bloating EXAM: TRANSABDOMINAL AND TRANSVAGINAL ULTRASOUND OF PELVIS TECHNIQUE: Study was performed transabdominally to optimize pelvic field of view evaluation and transvaginally to optimize internal visceral architecture evaluation. COMPARISON:  January 24, 2015 FINDINGS: Uterus Measurements: 6.4 x 4.0 x 4.4 cm. There is a leiomyoma in the superior fundal region slightly toward the right measuring 2.1 x 1.9 x 1.9 cm. There is a pedunculated leiomyoma measuring 3.0 x 2.4 x 2.9 cm arising superiorly. These leiomyomas were present on prior study. Endometrium Thickness: 2 mm.  No focal abnormality visualized. Right ovary Measurements: 2.8 x 2.1 x 2.7 cm. There are small cysts arising from the right ovary, largest measuring 1.6 x 1.5 x 1.5 cm. Left ovary Measurements: 2.9 x 2.6 x 3.5 cm. There are several small cysts arising from the left ovary measuring 2.0 x 2.0 x 2.4 cm. Other findings No abnormal free fluid. IMPRESSION: Uterine leiomyomas, similar to prior study. Small cystic areas in each ovary, probably physiologic follicles. This is  almost certainly benign, but follow up ultrasound is recommended in 1 year according to the Society of Radiologists in [place] Statement (D Clovis Riley et al. Management of Asymptomatic Ovarian and Other Adnexal Cysts Imaged at [place]: Society of Radiologists in [place] Statement 2010. Radiology 256 (Sept 2010): [id number].). Endometrial thickness within normal  limits for postmenopausal state. Electronically Signed   By: Lowella Grip III M.D.   On: 12/27/2015 15:56   US Pelvis Complete  Result Date: 12/27/2015 CLINICAL DATA:  Intermittent lower abdominal and pelvic pain and occasional abdominal bloating EXAM: TRANSABDOMINAL AND TRANSVAGINAL ULTRASOUND OF PELVIS TECHNIQUE: Study was performed transabdominally to optimize pelvic field of view evaluation and transvaginally to optimize internal visceral architecture evaluation. COMPARISON:  January 24, 2015 FINDINGS: Uterus Measurements: 6.4 x 4.0 x 4.4 cm. There is a leiomyoma in the superior fundal region slightly toward the right measuring 2.1 x 1.9 x 1.9 cm. There is a pedunculated leiomyoma measuring 3.0 x 2.4 x 2.9 cm arising superiorly. These leiomyomas were present on prior study. Endometrium Thickness: 2 mm.  No focal abnormality visualized. Right ovary Measurements: 2.8 x 2.1 x 2.7 cm. There are small cysts arising from the right ovary, largest measuring 1.6 x 1.5 x 1.5 cm. Left ovary Measurements: 2.9 x 2.6 x 3.5 cm. There are several small cysts arising from the left ovary measuring 2.0 x 2.0 x 2.4 cm. Other findings No abnormal free fluid. IMPRESSION: Uterine leiomyomas, similar to prior study. Small cystic areas in each ovary, probably physiologic follicles. This is almost certainly benign, but follow up ultrasound is recommended in 1 year according to the Society of Radiologists in [place] Statement (D Clovis Riley et al. Management of Asymptomatic Ovarian and Other Adnexal Cysts Imaged at [place]: Society of Radiologists in [place] Statement 2010. Radiology 256 (Sept 2010): [id number].). Endometrial thickness within normal limits for postmenopausal state. Electronically Signed   By: Lowella Grip III M.D.   On: 12/27/2015 15:56    Chanita Boden, DO  [facility] Pager [phone]  If 7PM-7AM, please contact night-coverage [email or web address] Password  [email or web address] 01/13/2016, 8:44 AM   LOS: 0 days

## 2016-01-16 ENCOUNTER — Encounter: Payer: Self-pay | Admitting: Neurology

## 2016-01-24 ENCOUNTER — Ambulatory Visit (INDEPENDENT_AMBULATORY_CARE_PROVIDER_SITE_OTHER): Payer: BLUE CROSS/BLUE SHIELD

## 2016-01-24 DIAGNOSIS — R002 Palpitations: Secondary | ICD-10-CM | POA: Diagnosis not present

## 2016-01-30 ENCOUNTER — Ambulatory Visit (INDEPENDENT_AMBULATORY_CARE_PROVIDER_SITE_OTHER): Payer: BLUE CROSS/BLUE SHIELD | Admitting: Neurology

## 2016-01-30 ENCOUNTER — Encounter: Payer: Self-pay | Admitting: Neurology

## 2016-01-30 ENCOUNTER — Telehealth: Payer: Self-pay | Admitting: *Deleted

## 2016-01-30 VITALS — BP 129/79 | HR 76 | Ht 66.0 in | Wt 181.0 lb

## 2016-01-30 DIAGNOSIS — R202 Paresthesia of skin: Secondary | ICD-10-CM

## 2016-01-30 DIAGNOSIS — R42 Dizziness and giddiness: Secondary | ICD-10-CM | POA: Diagnosis not present

## 2016-01-30 MED ORDER — NORTRIPTYLINE HCL 25 MG PO CAPS: 50.0000 mg | ORAL_CAPSULE | Freq: Every day | ORAL | 11 refills | Status: AC

## 2016-01-30 NOTE — Progress Notes (Signed)
PATIENT: [name] DOB: 06-28-1960  Chief Complaint  Patient presents with  . Peripheral Neuropathy    Reports numbness and pain in bilateral feet and hands. She is also concerned about her legs feeling restless (worse at night).    . PCP    Aura Dials, MD     HISTORICAL  [name] is a 55 years old right-handed female, seen in refer by her primary care doctor Aura Dials, Initial evaluation of bilateral feet and hands paresthesia, initially evaluation was January 30 2016  She had a history of left breast cancer, status post lobectomy in 2015, but did not require chemotherapy or radiation therapy, she had a history of tachycardia, has been taking metoprolol 25 mg twice a day since 2003.  She noticed numbness tingling at toes and fingertips since 2014, starting from bottom of her feet, not ascending to ankle level now, also noticed intermittent bilateral fingertips paresthesia at the same time, getting worse in recent few months, if she turns around suddenly, especially in standing position, she felt lightheaded,  She complains worsening bilateral feet paresthesia, especially her toes, difficulty sleeping, feeling restless, she also complains of flashing light in her peripheral visual field, not necessarily associated with headache,  Laboratory evaluation in 2017, CBC, BMP, TSH, uric acid was reported normal, fasting lipid profile showed cholesterol 196, LDL 120, UDS was negative, troponin was negative, CBC showed hemoglobin of 15, BMP was negative,  REVIEW OF SYSTEMS: Full 14 system review of systems performed and notable only for insomnia, restless leg, not enough sleep, cramps, ringing ears, chest pain, palpitation, double vision, weight gain   ALLERGIES: No Known Allergies  HOME MEDICATIONS: Current Outpatient Prescriptions  Medication Sig Dispense Refill  . cholecalciferol (VITAMIN D) 1000 units tablet Take 1,000 Units by mouth daily.    . Coenzyme Q10  (CO Q 10 PO) Take by mouth daily.    . hydrALAZINE (APRESOLINE) 10 MG tablet Take 1 tablet (10 mg total) by mouth 3 (three) times daily as needed. SBP >180 30 tablet 0  . metoprolol tartrate (LOPRESSOR) 25 MG tablet Take 25 mg by mouth 2 (two) times daily.     . Multiple Vitamin (MULTIVITAMIN WITH MINERALS) TABS tablet Take 1 tablet by mouth daily.     No current facility-administered medications for this visit.     PAST MEDICAL HISTORY: Past Medical History:  Diagnosis Date  . Breast cancer ([place])    s/p L mastectomy 2015. No chemo or radiation  . Dysfunction of both eustachian tubes   . Neutropenia ([place])   . Pain in both feet   . Tachycardia     PAST SURGICAL HISTORY: Past Surgical History:  Procedure Laterality Date  . CARDIAC CATHETERIZATION    . MASTECTOMY Left 2015    FAMILY HISTORY: Family History  Problem Relation Age of Onset  . Arrhythmia Mother   . Hypertension Mother   . Other Father     Unsure of medical history  . Asthma Brother   . Asthma Brother   . Asthma Brother     SOCIAL HISTORY:  Social History   Social History  . Marital status: Divorced    Spouse name: N/A  . Number of children: 2  . Years of education: Bachelors   Occupational History  . Nurse    Social History Main Topics  . Smoking status: Never Smoker  . Smokeless tobacco: Never Used  . Alcohol use No  . Drug use: No  .  Sexual activity: Not on file   Other Topics Concern  . Not on file   Social History Narrative   Lives at home alone.   Right-handed.   No caffeine use.     PHYSICAL EXAM   Vitals:   01/30/16 1055  BP: 129/79  Pulse: 76  Weight: 181 lb (82.1 kg)  Height: 5\' 6"  (1.676 m)    Not recorded      Body mass index is 29.21 kg/m.  PHYSICAL EXAMNIATION:  Gen: NAD, conversant, well nourised, obese, well groomed                     Cardiovascular: Regular rate rhythm, no peripheral edema, warm, nontender. Eyes: Conjunctivae clear without exudates or  hemorrhage Neck: Supple, no carotid bruits. Pulmonary: Clear to auscultation bilaterally   NEUROLOGICAL EXAM:  MENTAL STATUS: Speech:    Speech is normal; fluent and spontaneous with normal comprehension.  Cognition:     Orientation to time, place and person     Normal recent and remote memory     Normal Attention span and concentration     Normal Language, naming, repeating,spontaneous speech     Fund of knowledge   CRANIAL NERVES: CN II: Visual fields are full to confrontation. Fundoscopic exam is normal with sharp discs and no vascular changes. Pupils are round equal and briskly reactive to light. CN III, IV, VI: extraocular movement are normal. No ptosis. CN V: Facial sensation is intact to pinprick in all 3 divisions bilaterally. Corneal responses are intact.  CN VII: Face is symmetric with normal eye closure and smile. CN VIII: Hearing is normal to rubbing fingers CN IX, X: Palate elevates symmetrically. Phonation is normal. CN XI: Head turning and shoulder shrug are intact CN XII: Tongue is midline with normal movements and no atrophy.  MOTOR: There is no pronator drift of out-stretched arms. Muscle bulk and tone are normal. Muscle strength is normal.  REFLEXES: Reflexes are 2+ and symmetric at the biceps, triceps, knees, and ankles. Plantar responses are flexor.  SENSORY: Length dependent decreased to light touch, pinprick, and vibratory sensation at toes  COORDINATION: Rapid alternating movements and fine finger movements are intact. There is no dysmetria on finger-to-nose and heel-knee-shin.    GAIT/STANCE: Posture is normal. Gait is steady with normal steps, base, arm swing, and turning. Heel and toe walking are normal. Tandem gait is normal.  Romberg is absent.   DIAGNOSTIC DATA (LABS, IMAGING, TESTING) - I reviewed patient records, labs, notes, testing and imaging myself where available.   ASSESSMENT AND PLAN  [name] is a 55 y.o. female     Paresthesia  Most suggestive of peripheral neuropathy, possible small fiber neuropathy  Laboratory evaluations  EMG nerve conduction study, even consider skin biopsy needed Dizziness  Possible orthostatic blood pressure changes versus tachycardia related   Marcial Pacas, M.D. Ph.D.  [facility] [address], [place], [place] [address] Ph: [phone] Fax: [phone]  CC: Aura Dials, MD

## 2016-01-30 NOTE — Telephone Encounter (Addendum)
Pt was lost and arrived late to her new patient appointment.  Dr. Krista Blue will still see her.

## 2016-02-01 LAB — IMMUNOFIXATION ELECTROPHORESIS
IgA/Immunoglobulin A, Serum: 236 mg/dL (ref 87–352)
IgG (Immunoglobin G), Serum: 1523 mg/dL (ref 700–1600)
IgM (Immunoglobulin M), Srm: 90 mg/dL (ref 26–217)
TOTAL PROTEIN: 7.6 g/dL (ref 6.0–8.5)

## 2016-02-01 LAB — C-REACTIVE PROTEIN: CRP: 0.5 mg/L (ref 0.0–4.9)

## 2016-02-01 LAB — ANA W/REFLEX IF POSITIVE: ANA: NEGATIVE

## 2016-02-01 LAB — RPR: RPR: NONREACTIVE

## 2016-02-01 LAB — FOLATE: Folate: 17.3 ng/mL (ref >3.0)

## 2016-02-01 LAB — HGB A1C W/O EAG: HEMOGLOBIN A1C: 6.1 % — AB (ref 4.8–5.6)

## 2016-02-01 LAB — HIV ANTIBODY (ROUTINE TESTING W REFLEX): HIV SCREEN 4TH GENERATION: NONREACTIVE

## 2016-02-01 LAB — COPPER, SERUM: COPPER: 173 ug/dL — AB (ref 72–166)

## 2016-02-01 LAB — VITAMIN D 25 HYDROXY (VIT D DEFICIENCY, FRACTURES): Vit D, 25-Hydroxy: 42.7 ng/mL (ref 30.0–100.0)

## 2016-02-01 LAB — VITAMIN B12: Vitamin B-12: 1682 pg/mL — ABNORMAL HIGH (ref 211–946)

## 2016-02-04 ENCOUNTER — Other Ambulatory Visit: Payer: Self-pay

## 2016-02-04 ENCOUNTER — Ambulatory Visit (HOSPITAL_COMMUNITY): Payer: BLUE CROSS/BLUE SHIELD | Attending: Cardiovascular Disease

## 2016-02-04 DIAGNOSIS — R079 Chest pain, unspecified: Secondary | ICD-10-CM | POA: Insufficient documentation

## 2016-02-04 DIAGNOSIS — R002 Palpitations: Secondary | ICD-10-CM

## 2016-02-14 ENCOUNTER — Other Ambulatory Visit: Payer: Self-pay | Admitting: Gastroenterology

## 2016-02-14 DIAGNOSIS — R110 Nausea: Secondary | ICD-10-CM

## 2016-02-14 DIAGNOSIS — R1084 Generalized abdominal pain: Secondary | ICD-10-CM

## 2016-02-15 ENCOUNTER — Ambulatory Visit (INDEPENDENT_AMBULATORY_CARE_PROVIDER_SITE_OTHER): Payer: BLUE CROSS/BLUE SHIELD | Admitting: Neurology

## 2016-02-15 ENCOUNTER — Encounter (INDEPENDENT_AMBULATORY_CARE_PROVIDER_SITE_OTHER): Payer: Self-pay | Admitting: Neurology

## 2016-02-15 DIAGNOSIS — Z0289 Encounter for other administrative examinations: Secondary | ICD-10-CM

## 2016-02-15 DIAGNOSIS — R202 Paresthesia of skin: Secondary | ICD-10-CM

## 2016-02-15 DIAGNOSIS — R42 Dizziness and giddiness: Secondary | ICD-10-CM

## 2016-02-15 NOTE — Procedures (Signed)
Full Name: Fany Eigsti Gender: Female MRN #: [id number] Date of Birth: 1960-06-02    Visit Date: 02/15/2016 12:00 Age: 55 Years 65 Months Old Examining Physician: Marcial Pacas, MD  Referring Physician: Krista Blue History: 55 year old female presented with bilateral ptosis paresthesia  Summary of the findings: Nerve conduction study: Bilateral sural, superficial peroneal sensory responses were normal. Left peroneal to EDB and tibial motor responses were normal.  Electromyography: Selective needle examination of left lower extremity muscles and left lumbosacral paraspinals were normal.    Conclusion: This is a normal study. There is no electrodiagnostic evidence of large fiber peripheral neuropathy, or left lumbar sacral radiculopathy.    ------------------------------- Marcial Pacas, M.D.  [facility] [place], [place] [address] Tel: [phone] Fax: [phone]        [facility]    Nerve / Sites Rec. Site Peak Lat Ref.  Amp Ref. Segments Distance    ms ms V V  cm  L Superficial peroneal - Ankle     Lat leg Ankle 3.8 ?4.4 11 ?6 Lat leg - Ankle 14  L Sural - Ankle (Calf)     Calf Ankle 3.4 ?4.4 20 ?6 Calf - Ankle 14  R Sural - Ankle (Calf)     Calf Ankle 3.5 ?4.4 8 ?6 Calf - Ankle 14  R Superficial peroneal - Ankle     Lat leg Ankle 3.6 ?4.4 10 ?6 Lat leg - Ankle 14             [facility]    Nerve / Sites Muscle Latency Ref. Amplitude Ref. Rel Amp Segments Distance Lat Diff Velocity Ref. Area    ms ms mV mV %  cm ms m/s m/s mVms  L Peroneal - EDB     Ankle EDB 4.4 ?6.5 12.5 ?2.0 100 Ankle - EDB 9    35.0     Fib head EDB 9.8  12.2  98.2 Fib head - Ankle 28 5.4 52 ?44 34.1     Pop fossa EDB 11.7  12.5  102 Pop fossa - Fib head 10 1.9 53 ?44 33.0         Pop fossa - Ankle  7.3     L Tibial - AH     Ankle AH 4.4 ?5.8 10.4 ?4.0 100 Ankle - AH 9    32.3     Pop fossa AH 12.3  7.0  67.9 Pop fossa - Ankle 33 7.9 42 ?41 30.5         F  Wave    Nerve F  Lat Ref.   ms ms  L Peroneal - EDB 45.1 ?56.0  L Tibial - AH 52.8 ?56.0         EMG       EMG Summary Table    Spontaneous MUAP Recruitment  Muscle IA Fib PSW Fasc Other Amp Dur. Poly Pattern  L. Tibialis anterior Normal None None None _______ Normal Normal Normal Normal  L. Gastrocnemius (Medial head) Normal None None None _______ Normal Normal Normal Normal  L. Vastus lateralis Normal None None None _______ Normal Normal Normal Normal  L. Lumbar paraspinals (mid) Normal None None None _______ Normal Normal Normal Normal  L. Lumbar paraspinals (low) Normal None None None _______ Normal Normal Normal Normal  L. First dorsal interosseous Normal None None None _______ Normal Normal Normal Normal  L. Pronator teres Normal None None None _______ Normal Normal Normal Normal  L. Biceps brachii  Normal None None None _______ Normal Normal Normal Normal  L. Deltoid Normal None None None _______ Normal Normal Normal Normal  L. Triceps brachii Normal None None None _______ Normal Normal Normal Normal  L. Cervical paraspinals Normal None None None _______ Normal Normal Normal Normal

## 2016-02-20 ENCOUNTER — Ambulatory Visit
Admission: RE | Admit: 2016-02-20 | Discharge: 2016-02-20 | Disposition: A | Discharge status: Home / Self Care | Payer: BLUE CROSS/BLUE SHIELD | Source: Ambulatory Visit | Attending: Gastroenterology | Admitting: Gastroenterology

## 2016-02-20 DIAGNOSIS — R1084 Generalized abdominal pain: Secondary | ICD-10-CM

## 2016-02-20 DIAGNOSIS — R110 Nausea: Secondary | ICD-10-CM

## 2016-02-29 ENCOUNTER — Ambulatory Visit: Payer: BLUE CROSS/BLUE SHIELD | Admitting: Neurology

## 2016-03-13 ENCOUNTER — Encounter: Payer: Self-pay | Admitting: *Deleted

## 2016-03-19 ENCOUNTER — Ambulatory Visit: Payer: BLUE CROSS/BLUE SHIELD | Admitting: Neurology

## 2016-03-28 ENCOUNTER — Encounter: Payer: BLUE CROSS/BLUE SHIELD | Admitting: Neurology

## 2016-07-06 IMAGING — MG MM DIGITAL DIAGNOSTIC UNILAT*L*
2 series · 2 of 2 positions shown · non-contrast
Comparison: Previous exams

CLINICAL DATA: Post biopsy of a suspicious nodular mass in the left
breast at 1 o'clock.

EXAM:
DIAGNOSTIC LEFT MAMMOGRAM POST ULTRASOUND BIOPSY

[L ML]
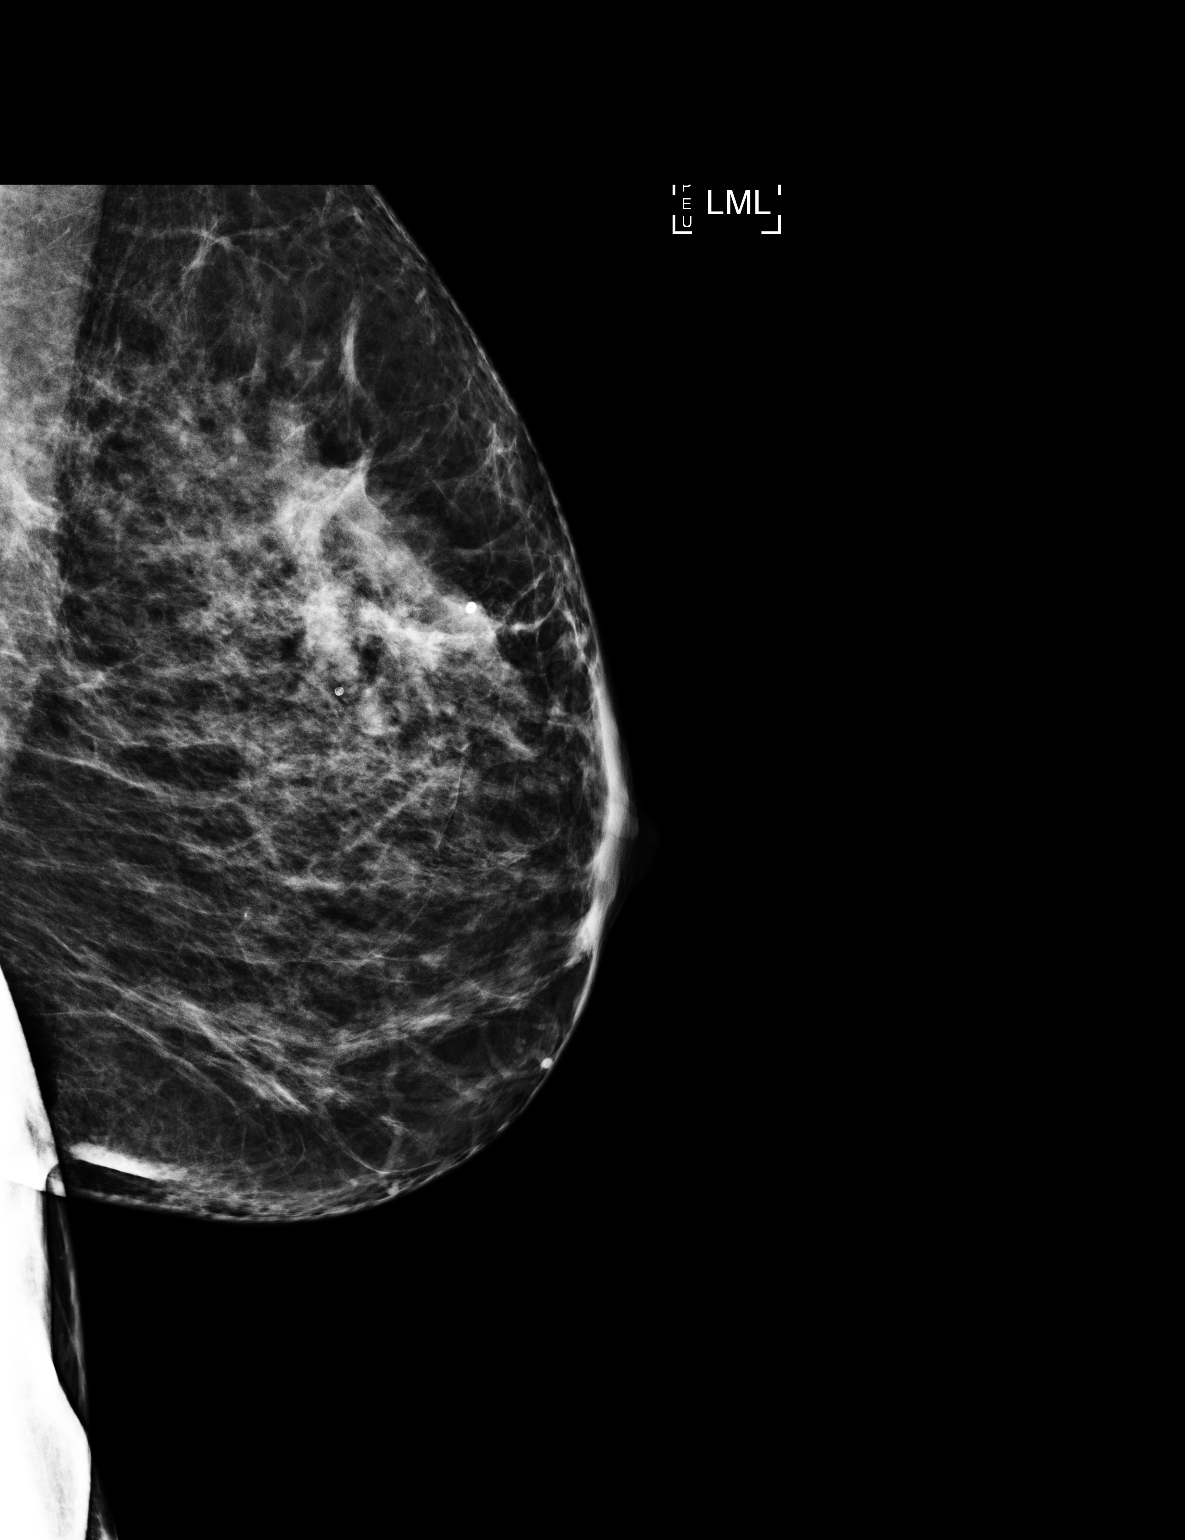

[L CC]
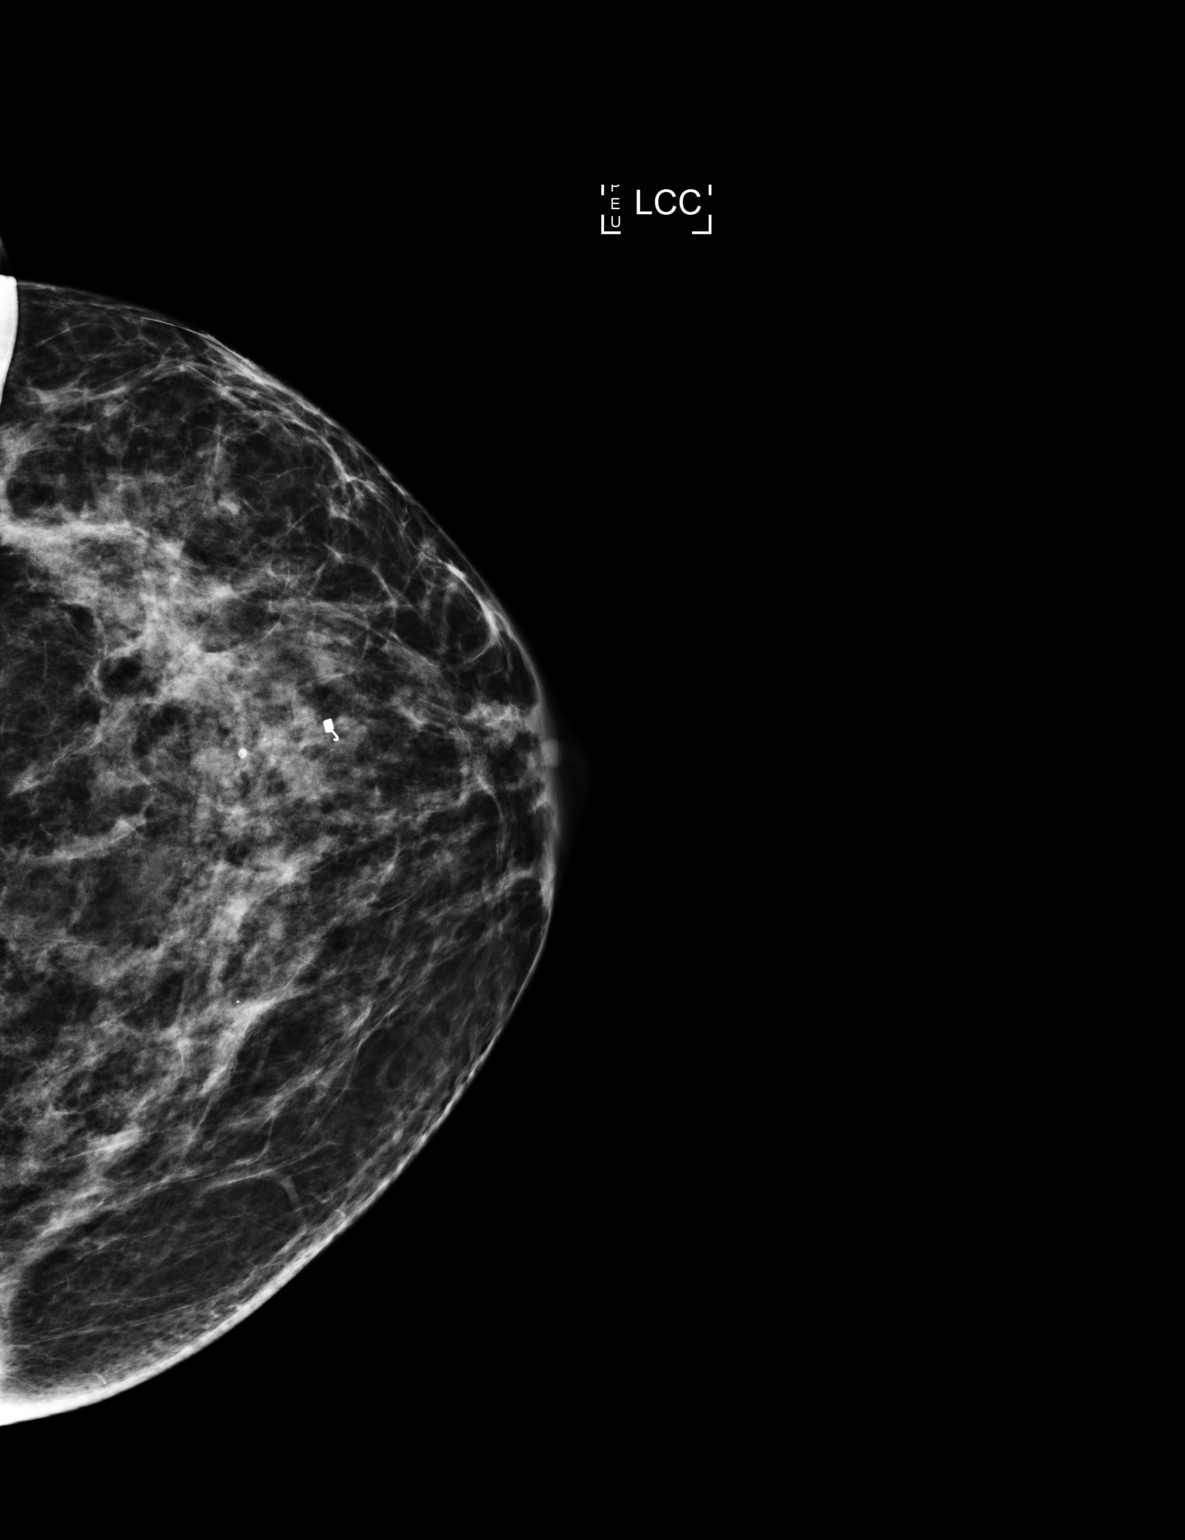

[2 of 2 positions shown; findings below may reference images not displayed]

FINDINGS: Mammographic images were obtained following ultrasound guided biopsy
of a suspicious mass in the left breast at 1 o'clock. A coil shaped
biopsy marking clip is present in the targeted region of the mass..
IMPRESSION: Appropriate positioning of coil shaped biopsy marking clip in the
left breast post biopsy of a mass at the 1 o'clock position.

Final Assessment: Post Procedure Mammograms for Marker Placement

## 2018-01-20 ENCOUNTER — Other Ambulatory Visit: Payer: Self-pay | Admitting: Cardiology

## 2018-01-20 DIAGNOSIS — E010 Iodine-deficiency related diffuse (endemic) goiter: Secondary | ICD-10-CM

## 2018-01-22 ENCOUNTER — Ambulatory Visit
Admission: RE | Admit: 2018-01-22 | Discharge: 2018-01-22 | Disposition: A | Discharge status: Home / Self Care | Payer: BLUE CROSS/BLUE SHIELD | Source: Ambulatory Visit | Attending: Cardiology | Admitting: Cardiology

## 2018-01-22 DIAGNOSIS — E010 Iodine-deficiency related diffuse (endemic) goiter: Secondary | ICD-10-CM

## 2018-06-12 IMAGING — US US ABDOMEN COMPLETE
1 series · 14 of 25 positions shown · non-contrast
Comparison: CT scan 12/21/2014

CLINICAL DATA: Lower abdominal pain

EXAM:
ABDOMEN ULTRASOUND COMPLETE

[Series 1: us abdomen complete · 0.32mm/px · 14 of 68 slices shown]
[im 1/68]
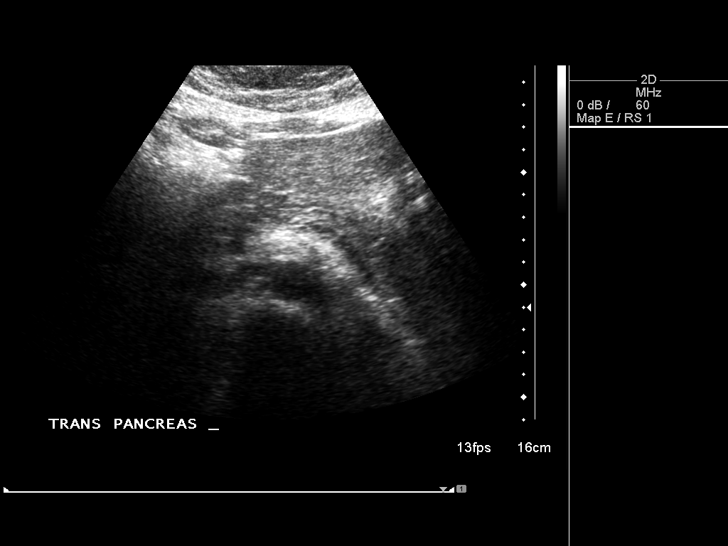
[im 6/68]
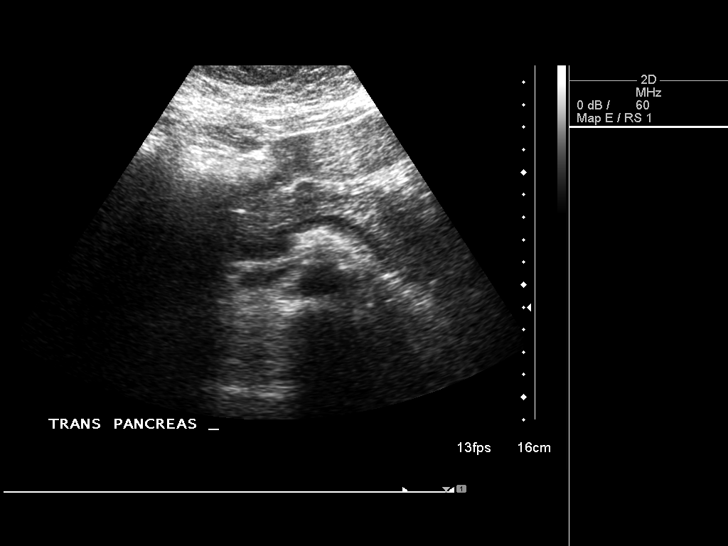
[im 12/68]
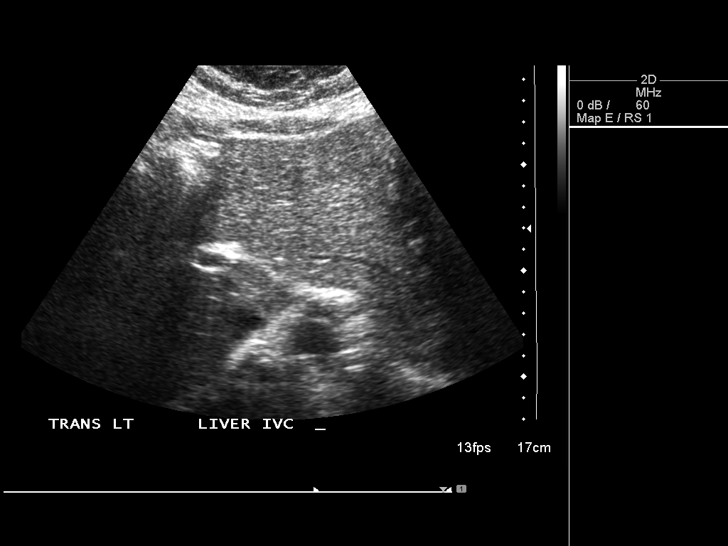
[im 17/68]
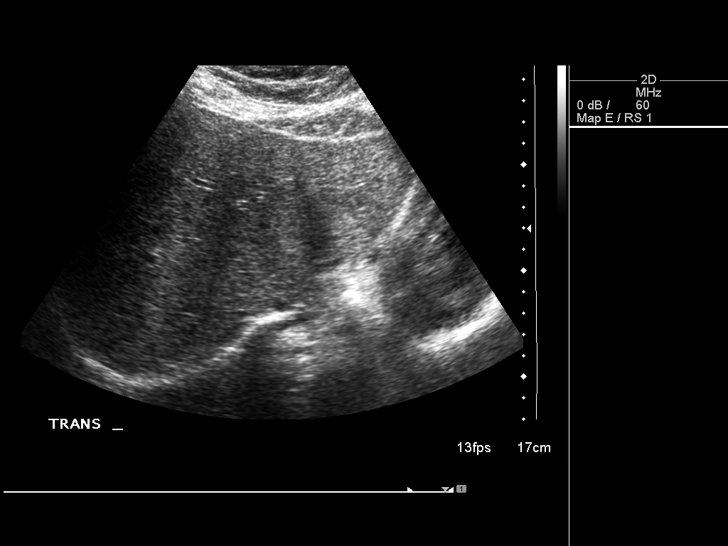
[im 23/68]
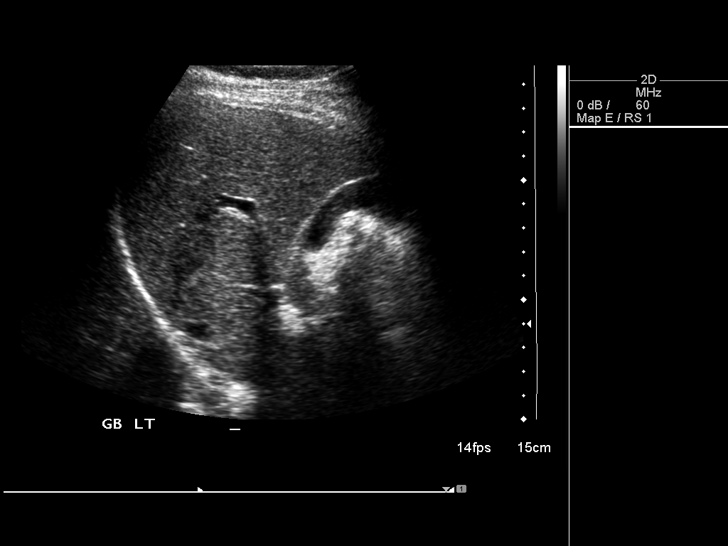
[im 26/68]
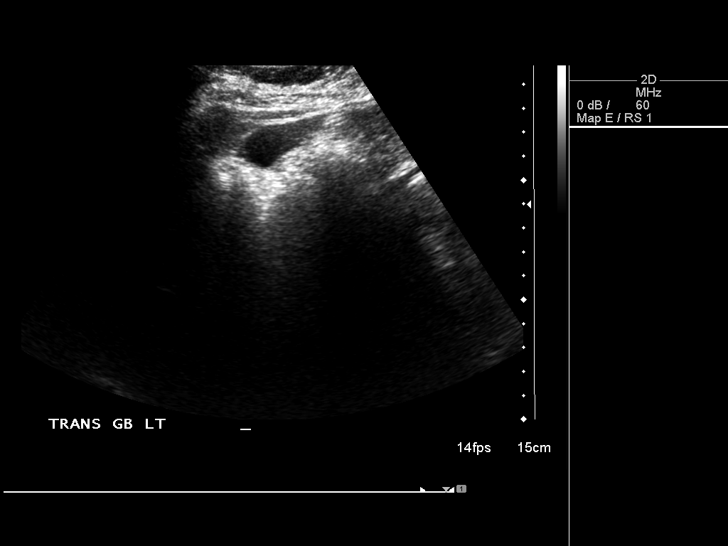
[im 31/68]
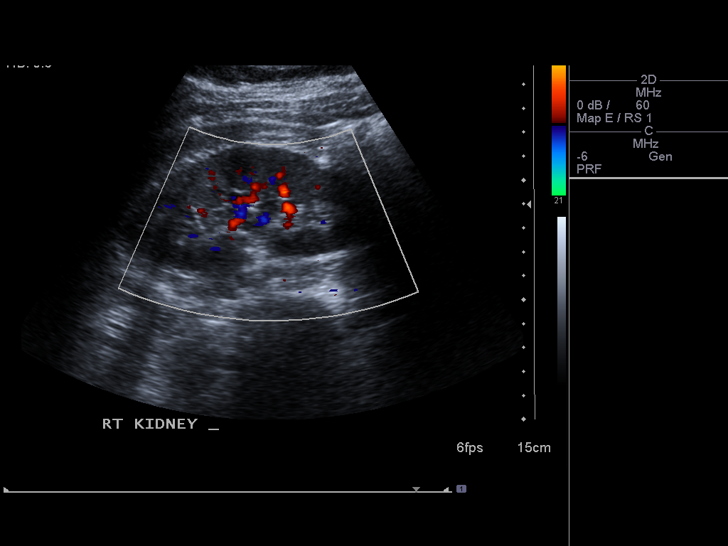
[im 37/68]
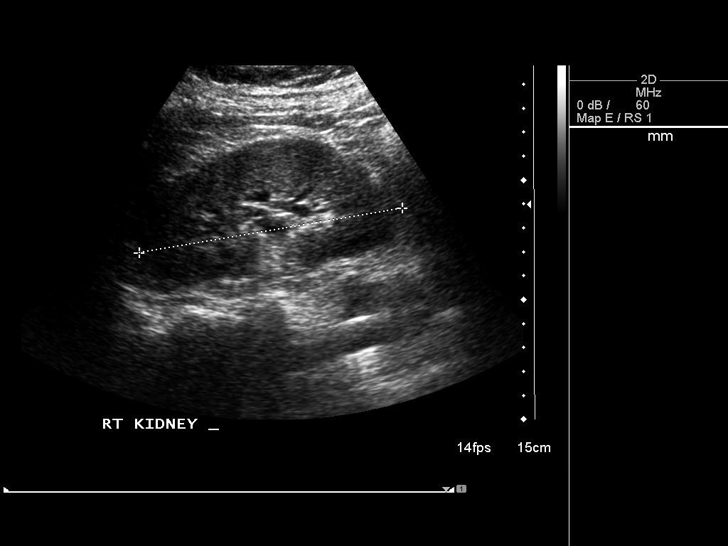
[im 42/68]
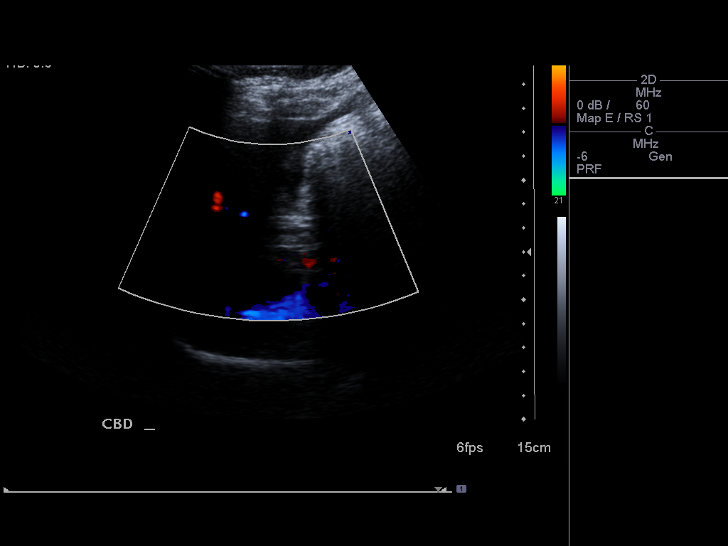
[im 45/68]
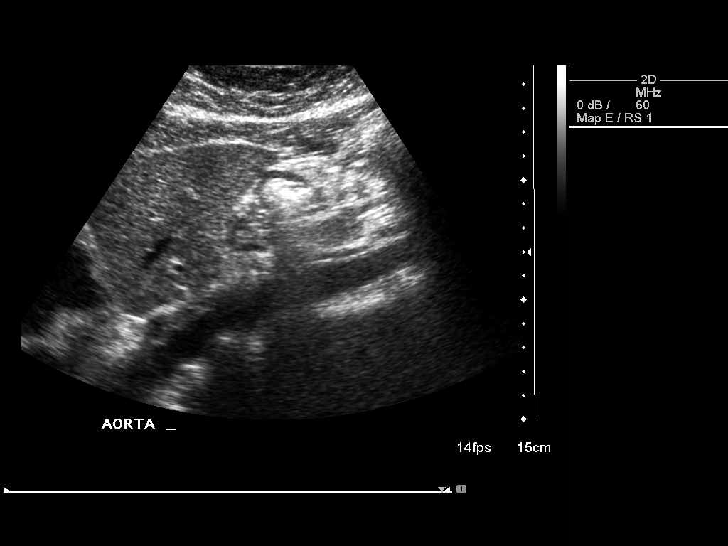
[im 51/68]
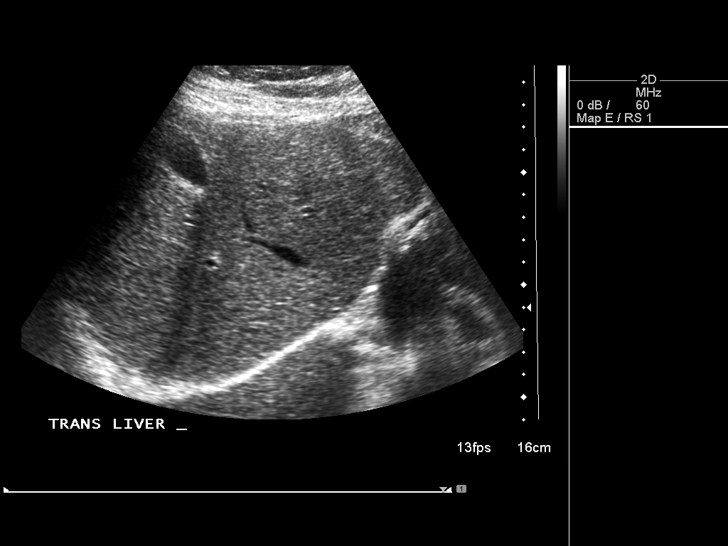
[im 56/68]
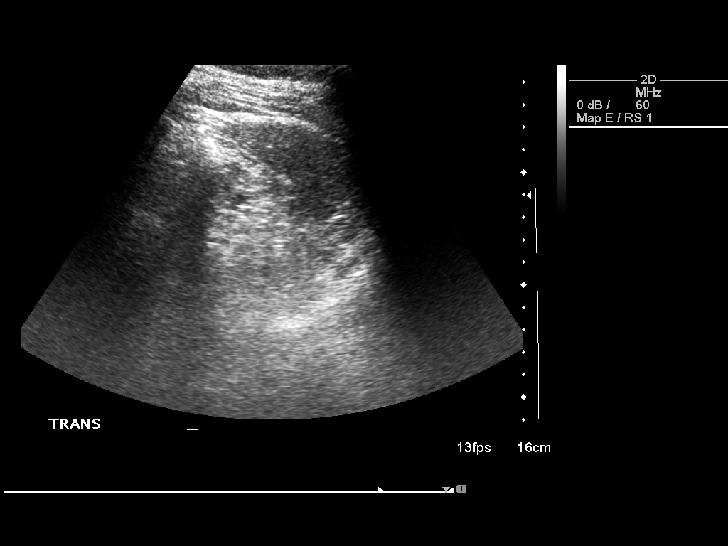
[im 62/68]
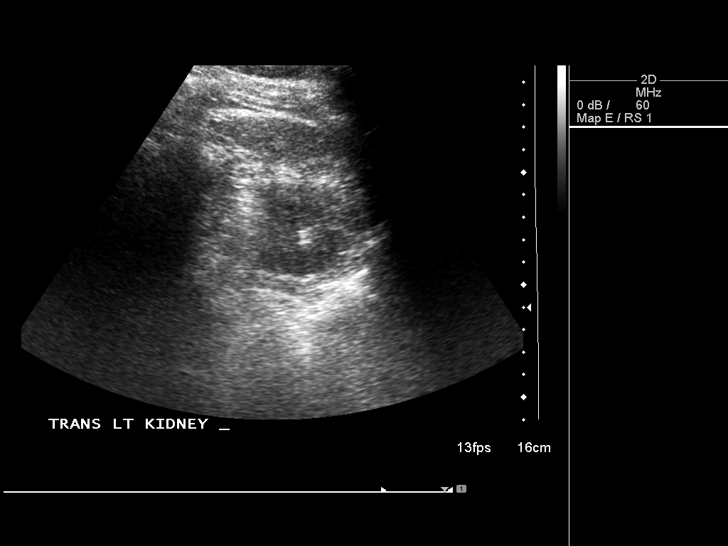
[im 68/68]
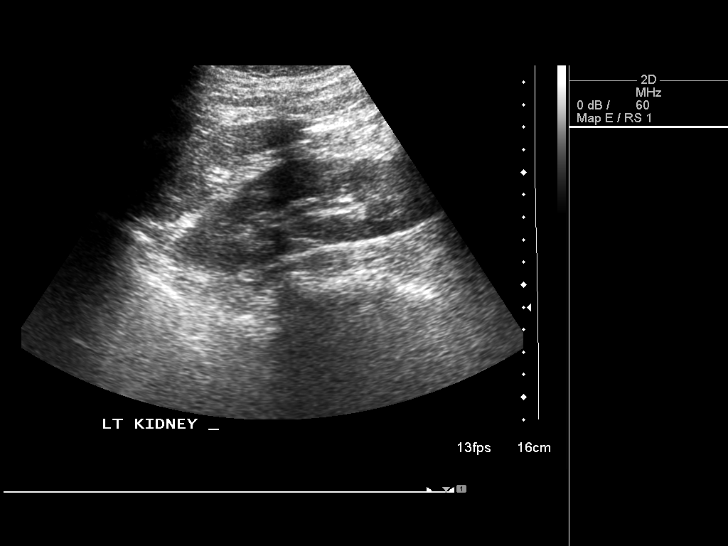

[14 of 25 positions shown; findings below may reference images not displayed]

FINDINGS: Gallbladder: No gallstones or wall thickening visualized. No
sonographic Murphy sign noted by sonographer.

Common bile duct: Diameter: 4.2 mm in diameter within normal limits

Liver: No focal lesion identified. Within normal limits in
parenchymal echogenicity.

IVC: No abnormality visualized.

Pancreas: Visualized portion unremarkable.

Spleen: Size and appearance within normal limits. Measures 4.3 cm in
length

Right Kidney: Length: Measures 11.2 cm. Echogenicity within normal
limits. No mass or hydronephrosis visualized.

Left Kidney: Length: 11.7 cm. Echogenicity within normal limits. No
mass or hydronephrosis visualized.

Abdominal aorta: No aneurysm visualized. Measures up to 2 cm in
diameter.

Other findings: None.
IMPRESSION: 1. No gallstones are noted within gallbladder.  Normal CBD.
2. No hydronephrosis or renal calculi.
3. No aortic aneurysm.

## 2018-06-29 IMAGING — CR DG CHEST 2V
2 series · 2 of 2 positions shown · non-contrast
Comparison: Chest radiograph dated 01/10/2016

CLINICAL DATA: 55-year-old female with chest pain and shortness of
breath

EXAM:
CHEST  2 VIEW

[chest pa]
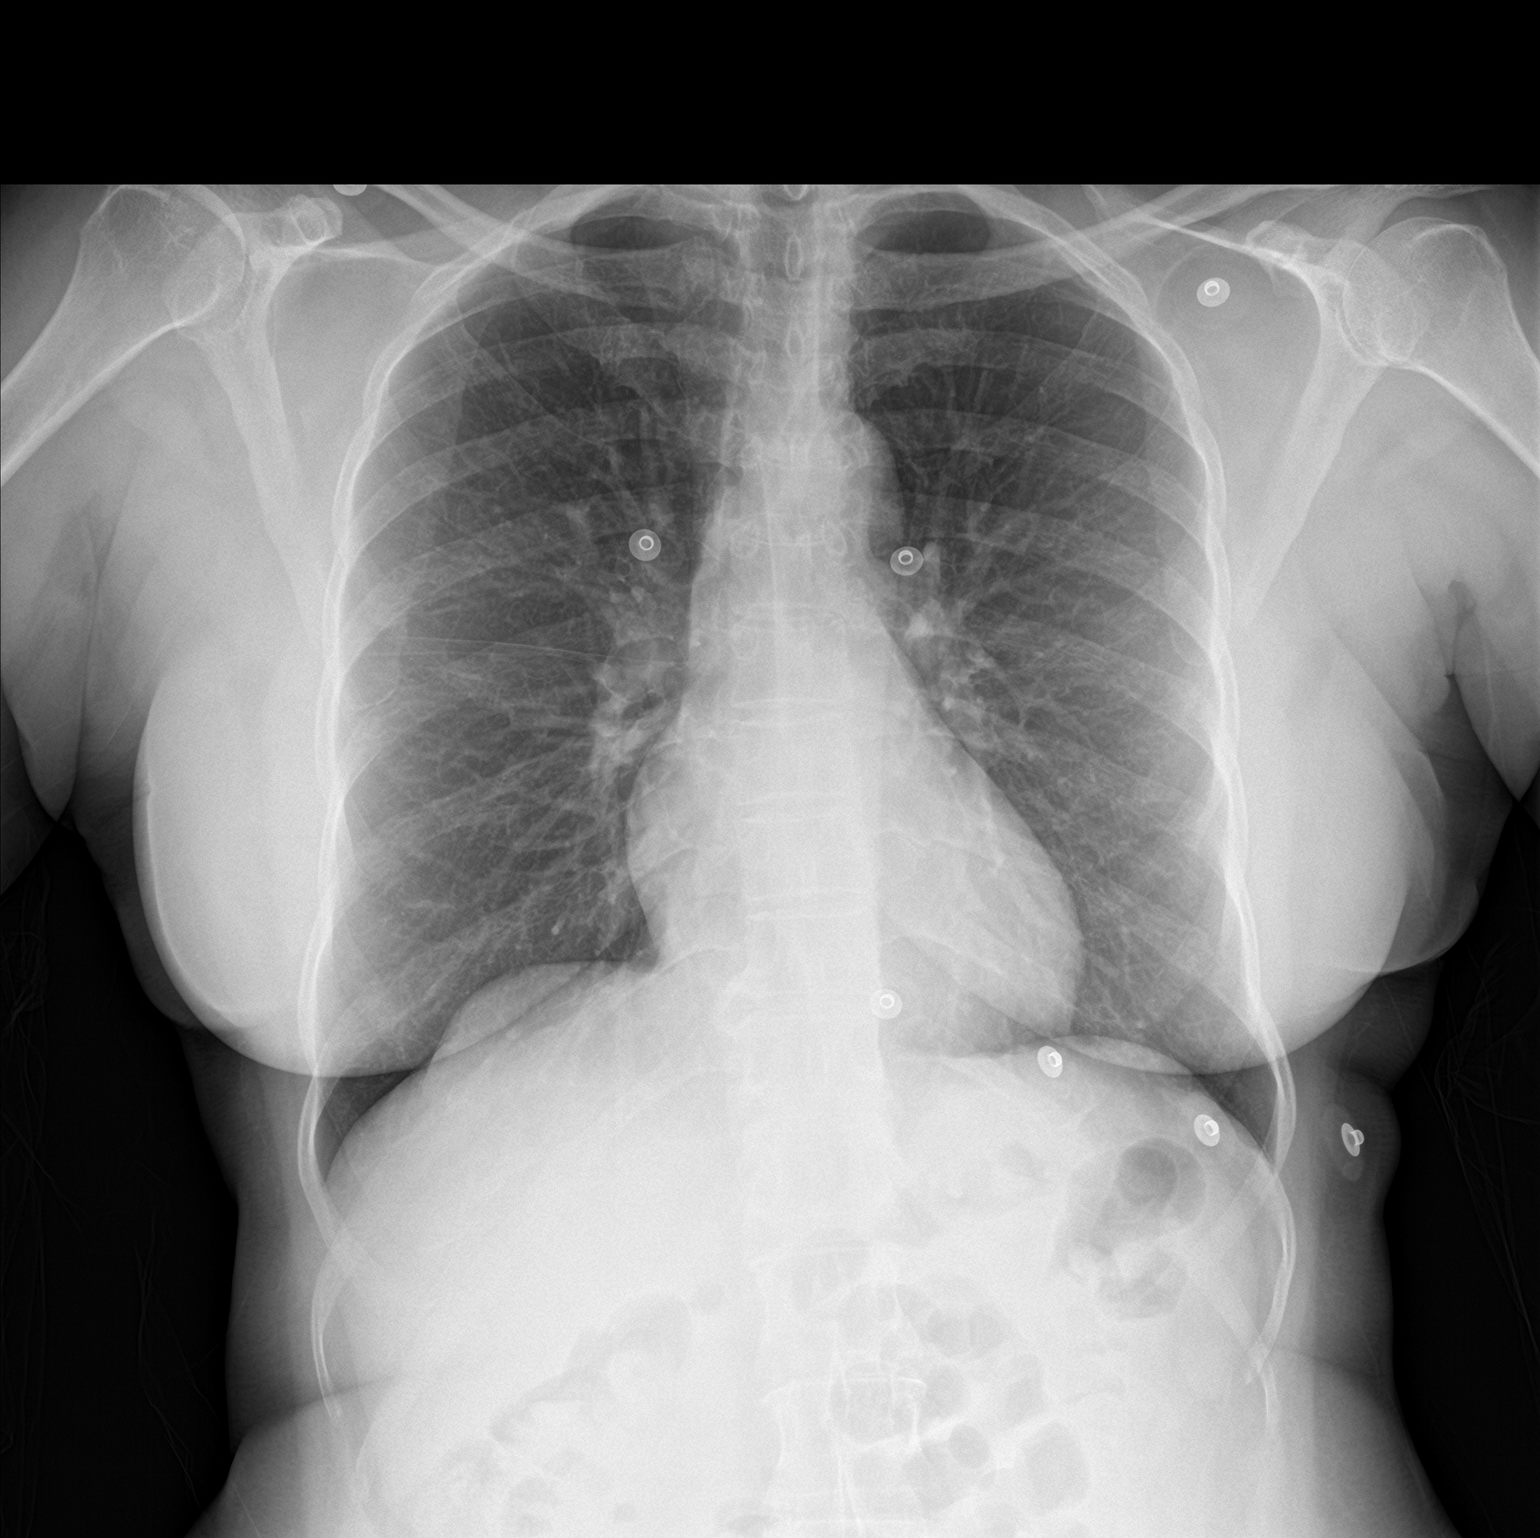

[chest lat]
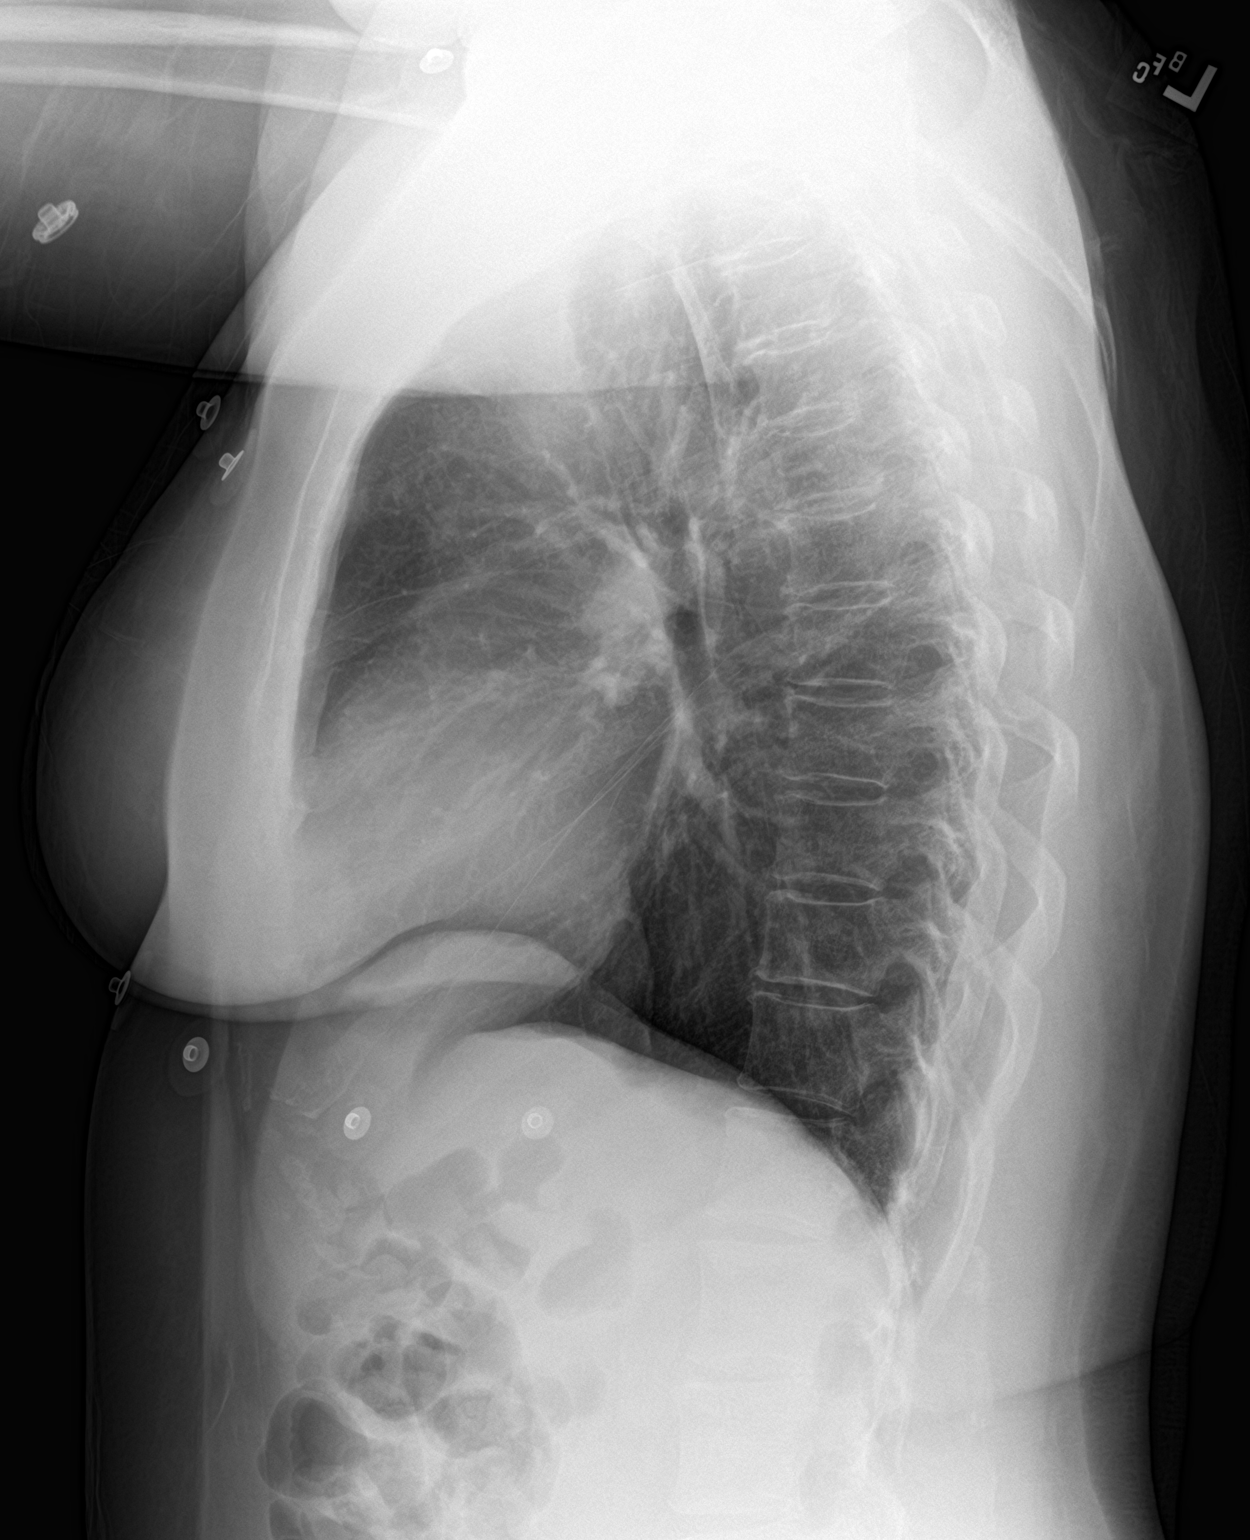

[2 of 2 positions shown; findings below may reference images not displayed]

FINDINGS: The heart size and mediastinal contours are within normal limits.
Both lungs are clear. The visualized skeletal structures are
unremarkable.
IMPRESSION: No active cardiopulmonary disease.

## 2019-09-09 ENCOUNTER — Other Ambulatory Visit: Payer: Self-pay | Admitting: Family Medicine

## 2019-09-09 DIAGNOSIS — R1084 Generalized abdominal pain: Secondary | ICD-10-CM

## 2019-09-27 ENCOUNTER — Other Ambulatory Visit: Payer: Self-pay | Admitting: Family Medicine

## 2019-09-27 ENCOUNTER — Ambulatory Visit
Admission: RE | Admit: 2019-09-27 | Discharge: 2019-09-27 | Disposition: A | Discharge status: Home / Self Care | Payer: 59 | Source: Ambulatory Visit | Attending: Family Medicine | Admitting: Family Medicine

## 2019-09-27 DIAGNOSIS — R1084 Generalized abdominal pain: Secondary | ICD-10-CM

## 2019-10-11 ENCOUNTER — Other Ambulatory Visit: Payer: Self-pay

## 2019-10-11 ENCOUNTER — Ambulatory Visit
Admission: RE | Admit: 2019-10-11 | Discharge: 2019-10-11 | Disposition: A | Discharge status: Home / Self Care | Payer: 59 | Source: Ambulatory Visit | Attending: Family Medicine | Admitting: Family Medicine

## 2019-10-11 DIAGNOSIS — R1084 Generalized abdominal pain: Secondary | ICD-10-CM

## 2019-10-11 MED ORDER — IOPAMIDOL (ISOVUE-300) INJECTION 61%
100.0000 mL | Freq: Once | INTRAVENOUS | Status: AC | PRN
  Administered 2019-10-11: 100 mL via INTRAVENOUS

## 2020-02-27 ENCOUNTER — Other Ambulatory Visit: Payer: Self-pay | Admitting: Family Medicine

## 2020-02-27 DIAGNOSIS — Z1231 Encounter for screening mammogram for malignant neoplasm of breast: Secondary | ICD-10-CM

## 2020-03-08 ENCOUNTER — Other Ambulatory Visit: Payer: Self-pay

## 2020-03-08 ENCOUNTER — Ambulatory Visit
Admission: RE | Admit: 2020-03-08 | Discharge: 2020-03-08 | Disposition: A | Discharge status: Home / Self Care | Payer: 59 | Source: Ambulatory Visit | Attending: Family Medicine | Admitting: Family Medicine

## 2020-03-08 DIAGNOSIS — Z1231 Encounter for screening mammogram for malignant neoplasm of breast: Secondary | ICD-10-CM

## 2020-03-14 ENCOUNTER — Other Ambulatory Visit: Payer: Self-pay | Admitting: Family Medicine

## 2020-03-14 ENCOUNTER — Other Ambulatory Visit: Payer: Self-pay

## 2020-03-14 ENCOUNTER — Ambulatory Visit
Admission: RE | Admit: 2020-03-14 | Discharge: 2020-03-14 | Disposition: A | Discharge status: Home / Self Care | Payer: 59 | Source: Ambulatory Visit | Attending: Family Medicine | Admitting: Family Medicine

## 2020-03-14 DIAGNOSIS — R928 Other abnormal and inconclusive findings on diagnostic imaging of breast: Secondary | ICD-10-CM

## 2020-03-14 DIAGNOSIS — R921 Mammographic calcification found on diagnostic imaging of breast: Secondary | ICD-10-CM

## 2020-05-17 ENCOUNTER — Ambulatory Visit
Admission: RE | Admit: 2020-05-17 | Discharge: 2020-05-17 | Disposition: A | Discharge status: Home / Self Care | Payer: 59 | Source: Ambulatory Visit | Attending: Family Medicine | Admitting: Family Medicine

## 2020-05-17 ENCOUNTER — Other Ambulatory Visit: Payer: Self-pay | Admitting: Family Medicine

## 2020-05-17 DIAGNOSIS — G8929 Other chronic pain: Secondary | ICD-10-CM

## 2020-05-17 DIAGNOSIS — M79661 Pain in right lower leg: Secondary | ICD-10-CM

## 2020-05-17 DIAGNOSIS — M79662 Pain in left lower leg: Secondary | ICD-10-CM

## 2020-05-17 DIAGNOSIS — M545 Low back pain, unspecified: Secondary | ICD-10-CM

## 2020-09-14 ENCOUNTER — Other Ambulatory Visit: Payer: Self-pay | Admitting: Rehabilitation

## 2020-09-14 DIAGNOSIS — M5416 Radiculopathy, lumbar region: Secondary | ICD-10-CM

## 2020-09-18 ENCOUNTER — Other Ambulatory Visit: Payer: Self-pay

## 2020-09-18 ENCOUNTER — Other Ambulatory Visit: Payer: Self-pay | Admitting: Family Medicine

## 2020-09-18 ENCOUNTER — Ambulatory Visit
Admission: RE | Admit: 2020-09-18 | Discharge: 2020-09-18 | Disposition: A | Discharge status: Home / Self Care | Payer: 59 | Source: Ambulatory Visit | Attending: Family Medicine | Admitting: Family Medicine

## 2020-09-18 DIAGNOSIS — R921 Mammographic calcification found on diagnostic imaging of breast: Secondary | ICD-10-CM

## 2020-09-20 ENCOUNTER — Other Ambulatory Visit: Payer: Self-pay | Admitting: Obstetrics and Gynecology

## 2020-09-21 ENCOUNTER — Other Ambulatory Visit: Payer: 59

## 2020-09-25 ENCOUNTER — Ambulatory Visit
Admission: RE | Admit: 2020-09-25 | Discharge: 2020-09-25 | Disposition: A | Discharge status: Home / Self Care | Payer: 59 | Source: Ambulatory Visit | Attending: Rehabilitation | Admitting: Rehabilitation

## 2020-09-25 DIAGNOSIS — M5416 Radiculopathy, lumbar region: Secondary | ICD-10-CM

## 2021-03-22 ENCOUNTER — Ambulatory Visit
Admission: RE | Admit: 2021-03-22 | Discharge: 2021-03-22 | Disposition: A | Discharge status: Home / Self Care | Payer: Managed Care, Other (non HMO) | Source: Ambulatory Visit | Attending: Family Medicine | Admitting: Family Medicine

## 2021-03-22 ENCOUNTER — Other Ambulatory Visit: Payer: Self-pay

## 2021-03-22 ENCOUNTER — Other Ambulatory Visit: Payer: Self-pay | Admitting: Family Medicine

## 2021-03-22 DIAGNOSIS — R921 Mammographic calcification found on diagnostic imaging of breast: Secondary | ICD-10-CM

## 2021-09-02 ENCOUNTER — Other Ambulatory Visit: Payer: Self-pay | Admitting: Obstetrics and Gynecology

## 2021-09-02 ENCOUNTER — Other Ambulatory Visit (HOSPITAL_COMMUNITY)
Admission: RE | Admit: 2021-09-02 | Discharge: 2021-09-02 | Disposition: A | Discharge status: Home / Self Care | Payer: Commercial Managed Care - HMO | Source: Ambulatory Visit | Attending: Obstetrics and Gynecology | Admitting: Obstetrics and Gynecology

## 2021-09-02 DIAGNOSIS — Z01419 Encounter for gynecological examination (general) (routine) without abnormal findings: Secondary | ICD-10-CM | POA: Diagnosis present

## 2021-09-05 LAB — CYTOLOGY - PAP
Comment: NEGATIVE
Diagnosis: NEGATIVE
Diagnosis: REACTIVE
High risk HPV: NEGATIVE

## 2021-10-22 ENCOUNTER — Other Ambulatory Visit: Payer: Self-pay | Admitting: Family Medicine

## 2021-10-22 DIAGNOSIS — R921 Mammographic calcification found on diagnostic imaging of breast: Secondary | ICD-10-CM

## 2022-01-29 ENCOUNTER — Other Ambulatory Visit: Payer: Self-pay | Admitting: Family Medicine

## 2022-01-29 DIAGNOSIS — M8580 Other specified disorders of bone density and structure, unspecified site: Secondary | ICD-10-CM

## 2022-01-30 ENCOUNTER — Other Ambulatory Visit: Payer: Self-pay | Admitting: Family Medicine

## 2022-01-30 DIAGNOSIS — R079 Chest pain, unspecified: Secondary | ICD-10-CM

## 2022-03-06 ENCOUNTER — Ambulatory Visit
Admission: RE | Admit: 2022-03-06 | Discharge: 2022-03-06 | Disposition: A | Discharge status: Home / Self Care | Payer: No Typology Code available for payment source | Source: Ambulatory Visit | Attending: Family Medicine | Admitting: Family Medicine

## 2022-03-06 DIAGNOSIS — R079 Chest pain, unspecified: Secondary | ICD-10-CM

## 2022-03-12 ENCOUNTER — Encounter (HOSPITAL_COMMUNITY): Payer: Self-pay | Admitting: Emergency Medicine

## 2022-03-12 ENCOUNTER — Emergency Department (HOSPITAL_COMMUNITY): Payer: Commercial Managed Care - HMO

## 2022-03-12 ENCOUNTER — Other Ambulatory Visit: Payer: Self-pay

## 2022-03-12 ENCOUNTER — Emergency Department (HOSPITAL_COMMUNITY)
Admission: EM | Admit: 2022-03-12 | Discharge: 2022-03-12 | Disposition: A | Discharge status: Home / Self Care | Payer: Commercial Managed Care - HMO | Attending: Emergency Medicine | Admitting: Emergency Medicine

## 2022-03-12 DIAGNOSIS — Z1152 Encounter for screening for COVID-19: Secondary | ICD-10-CM | POA: Diagnosis not present

## 2022-03-12 DIAGNOSIS — J111 Influenza due to unidentified influenza virus with other respiratory manifestations: Secondary | ICD-10-CM

## 2022-03-12 DIAGNOSIS — E119 Type 2 diabetes mellitus without complications: Secondary | ICD-10-CM | POA: Insufficient documentation

## 2022-03-12 DIAGNOSIS — I10 Essential (primary) hypertension: Secondary | ICD-10-CM | POA: Insufficient documentation

## 2022-03-12 DIAGNOSIS — Z79899 Other long term (current) drug therapy: Secondary | ICD-10-CM | POA: Insufficient documentation

## 2022-03-12 DIAGNOSIS — R509 Fever, unspecified: Secondary | ICD-10-CM | POA: Diagnosis present

## 2022-03-12 DIAGNOSIS — J101 Influenza due to other identified influenza virus with other respiratory manifestations: Secondary | ICD-10-CM | POA: Diagnosis not present

## 2022-03-12 LAB — D-DIMER, QUANTITATIVE: D-Dimer, Quant: >20 ug/mL-FEU — ABNORMAL HIGH (ref 0.00–0.50)

## 2022-03-12 LAB — BASIC METABOLIC PANEL
Anion gap: 12 (ref 5–15)
BUN: 7 mg/dL — ABNORMAL LOW (ref 8–23)
CO2: 25 mmol/L (ref 22–32)
Calcium: 9.5 mg/dL (ref 8.9–10.3)
Chloride: 101 mmol/L (ref 98–111)
Creatinine, Ser: 0.8 mg/dL (ref 0.44–1.00)
GFR, Estimated: >60 mL/min (ref >60)
Glucose, Bld: 89 mg/dL (ref 70–99)
Potassium: 3.6 mmol/L (ref 3.5–5.1)
Sodium: 138 mmol/L (ref 135–145)

## 2022-03-12 LAB — CBC WITH DIFFERENTIAL/PLATELET
Abs Immature Granulocytes: 0 10*3/uL (ref 0.00–0.07)
Basophils Absolute: 0 10*3/uL (ref 0.0–0.1)
Basophils Relative: 1 %
Eosinophils Absolute: 0 10*3/uL (ref 0.0–0.5)
Eosinophils Relative: 1 %
HCT: 37.7 % (ref 36.0–46.0)
Hemoglobin: 12.4 g/dL (ref 12.0–15.0)
Lymphocytes Relative: 53 %
Lymphs Abs: 2.4 10*3/uL (ref 0.7–4.0)
MCH: 28.8 pg (ref 26.0–34.0)
MCHC: 32.9 g/dL (ref 30.0–36.0)
MCV: 87.5 fL (ref 80.0–100.0)
Monocytes Absolute: 0.4 10*3/uL (ref 0.1–1.0)
Monocytes Relative: 9 %
Neutro Abs: 1.7 10*3/uL (ref 1.7–7.7)
Neutrophils Relative %: 36 %
Platelets: 174 10*3/uL (ref 150–400)
RBC: 4.31 MIL/uL (ref 3.87–5.11)
RDW: 14.9 % (ref 11.5–15.5)
WBC: 4.6 10*3/uL (ref 4.0–10.5)
nRBC: 0 % (ref 0.0–0.2)
nRBC: 0 /100 WBC

## 2022-03-12 LAB — TROPONIN I (HIGH SENSITIVITY)
Troponin I (High Sensitivity): 5 ng/L (ref <18)
Troponin I (High Sensitivity): 7 ng/L (ref <18)

## 2022-03-12 LAB — RESP PANEL BY RT-PCR (RSV, FLU A&B, COVID)  RVPGX2
Influenza A by PCR: POSITIVE — AB
Influenza B by PCR: NEGATIVE
Resp Syncytial Virus by PCR: NEGATIVE
SARS Coronavirus 2 by RT PCR: NEGATIVE

## 2022-03-12 MED ORDER — AEROCHAMBER PLUS FLO-VU LARGE MISC: 1.0000 | Freq: Once | Status: DC

## 2022-03-12 MED ORDER — PREDNISONE 20 MG PO TABS
60.0000 mg | ORAL_TABLET | Freq: Once | ORAL | Status: AC
  Administered 2022-03-12: 60 mg via ORAL
  Filled 2022-03-12: qty 3

## 2022-03-12 MED ORDER — IPRATROPIUM-ALBUTEROL 0.5-2.5 (3) MG/3ML IN SOLN
3.0000 mL | Freq: Once | RESPIRATORY_TRACT | Status: AC
  Administered 2022-03-12: 3 mL via RESPIRATORY_TRACT
  Filled 2022-03-12: qty 3

## 2022-03-12 MED ORDER — ALBUTEROL SULFATE HFA 108 (90 BASE) MCG/ACT IN AERS
2.0000 | INHALATION_SPRAY | Freq: Once | RESPIRATORY_TRACT | Status: AC
  Administered 2022-03-12: 2 via RESPIRATORY_TRACT
  Filled 2022-03-12: qty 6.7

## 2022-03-12 MED ORDER — BENZONATATE 200 MG PO CAPS: 200.0000 mg | ORAL_CAPSULE | Freq: Three times a day (TID) | ORAL | 0 refills | Status: AC | PRN

## 2022-03-12 MED ORDER — ACETAMINOPHEN 500 MG PO TABS
1000.0000 mg | ORAL_TABLET | Freq: Once | ORAL | Status: AC
  Administered 2022-03-12: 1000 mg via ORAL
  Filled 2022-03-12: qty 2

## 2022-03-12 MED ORDER — OSELTAMIVIR PHOSPHATE 75 MG PO CAPS: 75.0000 mg | ORAL_CAPSULE | Freq: Two times a day (BID) | ORAL | 0 refills | Status: AC

## 2022-03-12 MED ORDER — IOHEXOL 350 MG/ML SOLN
75.0000 mL | Freq: Once | INTRAVENOUS | Status: AC | PRN
  Administered 2022-03-12: 75 mL via INTRAVENOUS

## 2022-03-12 MED ORDER — HYDROCODONE BIT-HOMATROP MBR 5-1.5 MG/5ML PO SOLN: 5.0000 mL | Freq: Four times a day (QID) | ORAL | 0 refills | Status: AC | PRN

## 2022-03-12 NOTE — Discharge Instructions (Addendum)
Your flu test is positive.  Keep yourself hydrated.  Use Tylenol or Motrin as needed for aches and fever.  Follow-up with your doctor for recheck if you are not improving.  Return to the ED with difficulty breathing, chest pain or other concerns.  You have been given a scription for Hycodan syrup.  This is a cough medication that will also help with pain.  It may make you drowsy so do not drive or do dangerous activities while taking this medication.  You may take Tessalon Perles for cough as well.  Do not chew on these or suck on them.  Swallow them whole.  These will not make you drowsy, you may take them during the daytime.  Start your Tamiflu prescription as soon as you pick it up.  This is an antiviral to help with flu symptoms.  May shorten the duration of your symptoms.

## 2022-03-12 NOTE — ED Notes (Signed)
Patient transported to CT 

## 2022-03-12 NOTE — ED Provider Notes (Signed)
[facility] EMERGENCY DEPARTMENT Provider Note   CSN: [id number] Arrival date & time: 03/12/22  9562     History  Chief Complaint  Patient presents with   Fever    [name] is a 61 y.o. female.  Patient with a history of hypertension, hyperlipidemia, prediabetes presenting with a 4-day history of cough, congestion, fever, chest pain with coughing.  States he had fever as high as 104 since December 24.  Has had congestion in her chest and cough doctor for clearance and mucus.  Taking Motrin at home with some relief.  No travel or sick contacts.  She has chest pain with coughing that is worse with palpation and movement.  No history of asthma or COPD but uses albuterol as a as needed basis.  Has had some abdominal discomfort from coughing but no vomiting or diarrhea.  No pain with urination or blood in the urine.  Has not used any albuterol because she was concerned about her heart rate elevating.  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  States the only time her chest hurts is when she coughs.  Has not brought up anything when she coughs of the pain material.  The history is provided by the patient.  Fever Associated symptoms: chest pain, congestion, cough and rhinorrhea   Associated symptoms: no dysuria, no headaches, no myalgias, no nausea, no rash and no vomiting        Home Medications Prior to Admission medications   Medication Sig Start Date End Date Taking? Authorizing Provider  cholecalciferol (VITAMIN D) 1000 units tablet Take 1,000 Units by mouth daily.    [provider]  Coenzyme Q10 (CO Q 10 PO) Take by mouth daily.    [provider]  hydrALAZINE (APRESOLINE) 10 MG tablet Take 1 tablet (10 mg total) by mouth 3 (three) times daily as needed. SBP >180 01/13/16   Orson Eva, MD  metoprolol tartrate (LOPRESSOR) 25 MG tablet Take 25 mg by mouth 2 (two) times daily.     [provider]   Multiple Vitamin (MULTIVITAMIN WITH MINERALS) TABS tablet Take 1 tablet by mouth daily.    [provider]  nortriptyline (PAMELOR) 25 MG capsule Take 2 capsules (50 mg total) by mouth at bedtime. 01/30/16   Marcial Pacas, MD      Allergies    Patient has no known allergies.    Review of Systems   Review of Systems  Constitutional:  Positive for fever. Negative for appetite change and fatigue.  HENT:  Positive for congestion and rhinorrhea.   Respiratory:  Positive for cough.   Cardiovascular:  Positive for chest pain.  Gastrointestinal:  Negative for abdominal pain, nausea and vomiting.  Genitourinary:  Negative for dysuria and urgency.  Musculoskeletal:  Negative for arthralgias and myalgias.  Skin:  Negative for rash.  Neurological:  Negative for weakness and headaches.   all other systems are negative except as noted in the HPI and PMH.    Physical Exam Updated Vital Signs BP (!) 149/75 (BP Location: Left Arm)   Pulse 72   Temp 98.3 F (36.8 C) (Oral)   Resp 17   Wt 82 kg   SpO2 100%   BMI 29.18 kg/m  Physical Exam Vitals and nursing note reviewed.  Constitutional:      General: She is not in acute distress.    Appearance: She is well-developed.     Comments: Speaking  in full sentences, no distress  HENT:     Head: Normocephalic and atraumatic.     Mouth/Throat:     Pharynx: No oropharyngeal exudate.  Eyes:     Conjunctiva/sclera: Conjunctivae normal.     Pupils: Pupils are equal, round, and reactive to light.  Neck:     Comments: No meningismus. Cardiovascular:     Rate and Rhythm: Normal rate and regular rhythm.     Heart sounds: Normal heart sounds. No murmur heard. Pulmonary:     Effort: Pulmonary effort is normal. No respiratory distress.     Breath sounds: Wheezing present.     Comments: Scattered expiratory wheezing bilaterally Chest:     Chest wall: Tenderness present.  Abdominal:     Palpations: Abdomen is soft.     Tenderness: There is  no abdominal tenderness. There is no guarding or rebound.  Musculoskeletal:        General: No tenderness. Normal range of motion.     Cervical back: Normal range of motion and neck supple.  Skin:    General: Skin is warm.  Neurological:     General: No focal deficit present.     Mental Status: She is alert and oriented to person, place, and time. Mental status is at baseline.     Cranial Nerves: No cranial nerve deficit.     Motor: No abnormal muscle tone.     Coordination: Coordination normal.     Comments:  5/5 strength throughout. CN 2-12 intact.Equal grip strength.   Psychiatric:        Behavior: Behavior normal.     ED Results / Procedures / Treatments   Labs (all labs ordered are listed, but only abnormal results are displayed) Labs Reviewed  RESP PANEL BY RT-PCR (RSV, FLU A&B, COVID)  RVPGX2 - Abnormal; Notable for the following components:      Result Value   Influenza A by PCR POSITIVE (*)    All other components within normal limits  BASIC METABOLIC PANEL - Abnormal; Notable for the following components:   BUN 7 (*)    All other components within normal limits  D-DIMER, QUANTITATIVE - Abnormal; Notable for the following components:   D-Dimer, Quant >20.00 (*)    All other components within normal limits  CBC WITH DIFFERENTIAL/PLATELET  TROPONIN I (HIGH SENSITIVITY)  TROPONIN I (HIGH SENSITIVITY)    EKG EKG Interpretation  Date/Time:  Wednesday March 12 2022 15:17:39 EST Ventricular Rate:  75 PR Interval:  168 QRS Duration: 84 QT Interval:  364 QTC Calculation: 406 R Axis:   52 Text Interpretation: Normal sinus rhythm Normal ECG When compared with ECG of 13-Jan-2016 04:48, PREVIOUS ECG IS PRESENT No significant change was found Confirmed by Ezequiel Essex [phone]) on 03/12/2022 3:37:17 PM  Radiology DG Chest 2 View  Result Date: 03/12/2022 CLINICAL DATA:  Fever, cough. EXAM: CHEST - 2 VIEW COMPARISON:  January 13, 2016. FINDINGS: The heart size and  mediastinal contours are within normal limits. Both lungs are clear. The visualized skeletal structures are unremarkable. IMPRESSION: No active cardiopulmonary disease. Electronically Signed   By: Marijo Conception M.D.   On: 03/12/2022 08:13    Procedures Procedures    Medications Ordered in ED Medications  ipratropium-albuterol (DUONEB) 0.5-2.5 (3) MG/3ML nebulizer solution 3 mL (has no administration in time range)  predniSONE (DELTASONE) tablet 60 mg (has no administration in time range)    ED Course/ Medical Decision Making/ A&P  Medical Decision Making Amount and/or Complexity of Data Reviewed Labs: ordered. Decision-making details documented in ED Course. Radiology: ordered and independent interpretation performed. Decision-making details documented in ED Course. ECG/medicine tests: ordered and independent interpretation performed. Decision-making details documented in ED Course.  Risk Prescription drug management.   4 days of cough, fever, chest pain with coughing and congestion.  No hypoxia or increased work of breathing.  She does have some wheezing on arrival was given bronchodilators.  Chest x-ray is needed for pneumonia.  Results reviewed interpreted by me.  Patient did have coronary CT study this week which showed a calcium score of 0.  Suspicion for ACS.  Patient found to be influenza positive.  No hypoxia or increased work of breathing.  Troponin negative x 1.  D-dimer greater than 20.  CT angiogram we obtained to rule out pulmonary embolism or other acute findings such as pneumonia.  Care to be transferred at shift change pending CT scan to rule out PE.  Will anticipate discharge home with supportive care and Tamiflu.        Final Clinical Impression(s) / ED Diagnoses Final diagnoses:  Influenza    Rx / DC Orders ED Discharge Orders     None         Raquell Richer, Annie Main, MD 03/12/22 1622

## 2022-03-12 NOTE — ED Provider Notes (Signed)
Flu positive.  Elevated D dimer. CT PE if negative, anticipate d/c with tamiflu. Physical Exam  BP (!) 142/69   Pulse 72   Temp 98.6 F (37 C) (Oral)   Resp 18   Wt 82 kg   SpO2 96%   BMI 29.18 kg/m   Physical Exam  Procedures  Procedures  ED Course / MDM    Medical Decision Making Amount and/or Complexity of Data Reviewed Labs: ordered. Radiology: ordered. ECG/medicine tests: ordered.  Risk OTC drugs. Prescription drug management.   Patient updated on results.  She is nontoxic with no respiratory distress.  She reports she has had severe cough and pain with cough.  I will add Hycodan to prescriptions as well as Tessalon Perles.  Patient has had an albuterol inhaler with spacer dispensed from the emergency department for wheezing and coughing.       Charlesetta Shanks, MD 03/12/22 [phone]

## 2022-03-12 NOTE — ED Notes (Signed)
Pt A&OX4 ambulatory at d/c with independent steady gait. Pt verbalized understanding of d/c instructions, prescriptions and follow up care. 

## 2022-03-12 NOTE — ED Triage Notes (Signed)
Fever intermittently (high 104F) since christmas eve  along with cough (Productively tan) and congestion. Took motrin '800mg'$  for fever with relief.  No known sick contacts.

## 2022-03-24 ENCOUNTER — Other Ambulatory Visit: Payer: Self-pay | Admitting: Family Medicine

## 2022-03-24 ENCOUNTER — Ambulatory Visit
Admission: RE | Admit: 2022-03-24 | Discharge: 2022-03-24 | Disposition: A | Discharge status: Home / Self Care | Payer: Commercial Managed Care - HMO | Source: Ambulatory Visit | Attending: Family Medicine | Admitting: Family Medicine

## 2022-03-24 DIAGNOSIS — R921 Mammographic calcification found on diagnostic imaging of breast: Secondary | ICD-10-CM

## 2022-07-25 ENCOUNTER — Ambulatory Visit
Admission: RE | Admit: 2022-07-25 | Discharge: 2022-07-25 | Disposition: A | Discharge status: Home / Self Care | Payer: Commercial Managed Care - HMO | Source: Ambulatory Visit | Attending: Family Medicine | Admitting: Family Medicine

## 2022-07-25 DIAGNOSIS — M8580 Other specified disorders of bone density and structure, unspecified site: Secondary | ICD-10-CM

## 2023-12-02 DIAGNOSIS — I10 Essential (primary) hypertension: Secondary | ICD-10-CM | POA: Diagnosis not present

## 2023-12-02 DIAGNOSIS — Z6825 Body mass index (BMI) 25.0-25.9, adult: Secondary | ICD-10-CM | POA: Diagnosis not present

## 2023-12-02 DIAGNOSIS — R7303 Prediabetes: Secondary | ICD-10-CM | POA: Diagnosis not present

## 2023-12-02 DIAGNOSIS — E785 Hyperlipidemia, unspecified: Secondary | ICD-10-CM | POA: Diagnosis not present

## 2023-12-02 DIAGNOSIS — E559 Vitamin D deficiency, unspecified: Secondary | ICD-10-CM | POA: Diagnosis not present

## 2023-12-02 DIAGNOSIS — E663 Overweight: Secondary | ICD-10-CM | POA: Diagnosis not present

## 2023-12-02 DIAGNOSIS — K5901 Slow transit constipation: Secondary | ICD-10-CM | POA: Diagnosis not present

## 2023-12-08 DIAGNOSIS — Z0100 Encounter for examination of eyes and vision without abnormal findings: Secondary | ICD-10-CM | POA: Diagnosis not present

## 2024-01-20 DIAGNOSIS — Z01419 Encounter for gynecological examination (general) (routine) without abnormal findings: Secondary | ICD-10-CM | POA: Diagnosis not present

## 2024-01-20 DIAGNOSIS — N763 Subacute and chronic vulvitis: Secondary | ICD-10-CM | POA: Diagnosis not present

## 2024-01-25 ENCOUNTER — Other Ambulatory Visit: Payer: Self-pay | Admitting: Family Medicine

## 2024-01-25 DIAGNOSIS — Z1231 Encounter for screening mammogram for malignant neoplasm of breast: Secondary | ICD-10-CM

## 2024-01-28 ENCOUNTER — Other Ambulatory Visit: Payer: Self-pay | Admitting: Family Medicine

## 2024-01-28 ENCOUNTER — Ambulatory Visit
Admission: RE | Admit: 2024-01-28 | Discharge: 2024-01-28 | Disposition: A | Discharge status: Home / Self Care | Payer: Medicare (Managed Care) | Source: Ambulatory Visit | Attending: Family Medicine | Admitting: Family Medicine

## 2024-01-28 DIAGNOSIS — Z1231 Encounter for screening mammogram for malignant neoplasm of breast: Secondary | ICD-10-CM

## 2024-02-02 ENCOUNTER — Other Ambulatory Visit: Payer: Self-pay | Admitting: Family Medicine

## 2024-02-02 DIAGNOSIS — R928 Other abnormal and inconclusive findings on diagnostic imaging of breast: Secondary | ICD-10-CM

## 2024-03-14 DIAGNOSIS — N390 Urinary tract infection, site not specified: Secondary | ICD-10-CM | POA: Diagnosis not present

## 2024-03-14 DIAGNOSIS — Z0000 Encounter for general adult medical examination without abnormal findings: Secondary | ICD-10-CM | POA: Diagnosis not present

## 2024-03-14 DIAGNOSIS — E559 Vitamin D deficiency, unspecified: Secondary | ICD-10-CM | POA: Diagnosis not present

## 2024-03-14 DIAGNOSIS — R8290 Unspecified abnormal findings in urine: Secondary | ICD-10-CM | POA: Diagnosis not present

## 2024-03-14 DIAGNOSIS — R7303 Prediabetes: Secondary | ICD-10-CM | POA: Diagnosis not present

## 2024-03-15 ENCOUNTER — Other Ambulatory Visit: Payer: Self-pay | Admitting: Family Medicine

## 2024-03-15 ENCOUNTER — Ambulatory Visit
Admission: RE | Admit: 2024-03-15 | Discharge: 2024-03-15 | Disposition: A | Discharge status: Home / Self Care | Payer: Medicare (Managed Care) | Source: Ambulatory Visit | Attending: Family Medicine | Admitting: Family Medicine

## 2024-03-15 DIAGNOSIS — R059 Cough, unspecified: Secondary | ICD-10-CM

## 2024-03-15 DIAGNOSIS — R928 Other abnormal and inconclusive findings on diagnostic imaging of breast: Secondary | ICD-10-CM
